# Patient Record
Sex: Female | Born: 1945 | Race: White | Hispanic: No | Smoking: Never smoker
Health system: Southern US, Community
[De-identification: ages and names within clinical notes are randomized; demographics above are authoritative.]

## PROBLEM LIST (undated history)

## (undated) DIAGNOSIS — N183 Chronic kidney disease, stage 3 unspecified: Secondary | ICD-10-CM

## (undated) DIAGNOSIS — K5792 Diverticulitis of intestine, part unspecified, without perforation or abscess without bleeding: Secondary | ICD-10-CM

## (undated) DIAGNOSIS — L989 Disorder of the skin and subcutaneous tissue, unspecified: Secondary | ICD-10-CM

## (undated) DIAGNOSIS — R112 Nausea with vomiting, unspecified: Secondary | ICD-10-CM

## (undated) DIAGNOSIS — Z9049 Acquired absence of other specified parts of digestive tract: Secondary | ICD-10-CM

## (undated) DIAGNOSIS — N289 Disorder of kidney and ureter, unspecified: Secondary | ICD-10-CM

## (undated) DIAGNOSIS — G43909 Migraine, unspecified, not intractable, without status migrainosus: Secondary | ICD-10-CM

## (undated) DIAGNOSIS — K219 Gastro-esophageal reflux disease without esophagitis: Secondary | ICD-10-CM

## (undated) DIAGNOSIS — Z9889 Other specified postprocedural states: Secondary | ICD-10-CM

## (undated) DIAGNOSIS — M1990 Unspecified osteoarthritis, unspecified site: Secondary | ICD-10-CM

## (undated) DIAGNOSIS — I10 Essential (primary) hypertension: Secondary | ICD-10-CM

## (undated) DIAGNOSIS — Z8719 Personal history of other diseases of the digestive system: Secondary | ICD-10-CM

## (undated) HISTORY — DX: Disorder of the skin and subcutaneous tissue, unspecified: L98.9

## (undated) HISTORY — DX: Disorder of kidney and ureter, unspecified: N28.9

## (undated) HISTORY — DX: Essential (primary) hypertension: I10

## (undated) HISTORY — PX: UPPER GASTROINTESTINAL ENDOSCOPY: SHX188

## (undated) HISTORY — PX: HEMORROIDECTOMY: SUR656

## (undated) HISTORY — DX: Gastro-esophageal reflux disease without esophagitis: K21.9

## (undated) HISTORY — DX: Diverticulitis of intestine, part unspecified, without perforation or abscess without bleeding: K57.92

## (undated) HISTORY — PX: COLONOSCOPY: SHX174

---
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
## 1981-04-21 HISTORY — PX: CYSTECTOMY: SUR359

## 1982-04-21 HISTORY — PX: BUNIONECTOMY: SHX129

## 1990-04-21 HISTORY — PX: LAPAROSCOPIC CHOLECYSTECTOMY: SUR755

## 2000-03-19 ENCOUNTER — Other Ambulatory Visit: Admission: RE | Admit: 2000-03-19 | Discharge: 2000-03-19 | Payer: Self-pay | Admitting: *Deleted

## 2001-03-21 DIAGNOSIS — L989 Disorder of the skin and subcutaneous tissue, unspecified: Secondary | ICD-10-CM

## 2001-03-21 HISTORY — DX: Disorder of the skin and subcutaneous tissue, unspecified: L98.9

## 2001-03-22 ENCOUNTER — Other Ambulatory Visit: Admission: RE | Admit: 2001-03-22 | Discharge: 2001-03-22 | Payer: Self-pay | Admitting: *Deleted

## 2002-12-03 ENCOUNTER — Ambulatory Visit (HOSPITAL_COMMUNITY): Admission: RE | Admit: 2002-12-03 | Discharge: 2002-12-03 | Payer: Self-pay | Admitting: Emergency Medicine

## 2002-12-03 ENCOUNTER — Encounter: Payer: Self-pay | Admitting: Emergency Medicine

## 2005-10-08 ENCOUNTER — Other Ambulatory Visit: Admission: RE | Admit: 2005-10-08 | Discharge: 2005-10-08 | Payer: Self-pay | Admitting: Gynecology

## 2006-12-18 ENCOUNTER — Other Ambulatory Visit: Admission: RE | Admit: 2006-12-18 | Discharge: 2006-12-18 | Payer: Self-pay | Admitting: Gynecology

## 2008-01-13 ENCOUNTER — Encounter: Payer: Self-pay | Admitting: Women's Health

## 2008-01-13 ENCOUNTER — Other Ambulatory Visit: Admission: RE | Admit: 2008-01-13 | Discharge: 2008-01-13 | Payer: Self-pay | Admitting: Gynecology

## 2008-01-13 ENCOUNTER — Ambulatory Visit: Payer: Self-pay | Admitting: Women's Health

## 2008-06-24 ENCOUNTER — Encounter (INDEPENDENT_AMBULATORY_CARE_PROVIDER_SITE_OTHER): Payer: Self-pay | Admitting: *Deleted

## 2008-06-24 ENCOUNTER — Inpatient Hospital Stay (HOSPITAL_COMMUNITY): Admission: EM | Admit: 2008-06-24 | Discharge: 2008-06-27 | Payer: Self-pay | Admitting: Emergency Medicine

## 2008-06-27 ENCOUNTER — Encounter (INDEPENDENT_AMBULATORY_CARE_PROVIDER_SITE_OTHER): Payer: Self-pay | Admitting: *Deleted

## 2008-08-18 DIAGNOSIS — I10 Essential (primary) hypertension: Secondary | ICD-10-CM | POA: Insufficient documentation

## 2008-08-18 DIAGNOSIS — K5732 Diverticulitis of large intestine without perforation or abscess without bleeding: Secondary | ICD-10-CM | POA: Insufficient documentation

## 2008-08-18 DIAGNOSIS — K219 Gastro-esophageal reflux disease without esophagitis: Secondary | ICD-10-CM | POA: Insufficient documentation

## 2008-08-18 DIAGNOSIS — K5730 Diverticulosis of large intestine without perforation or abscess without bleeding: Secondary | ICD-10-CM | POA: Insufficient documentation

## 2008-08-18 DIAGNOSIS — D649 Anemia, unspecified: Secondary | ICD-10-CM | POA: Insufficient documentation

## 2008-08-18 DIAGNOSIS — N281 Cyst of kidney, acquired: Secondary | ICD-10-CM | POA: Insufficient documentation

## 2008-08-23 ENCOUNTER — Ambulatory Visit: Payer: Self-pay | Admitting: Internal Medicine

## 2008-08-23 DIAGNOSIS — E669 Obesity, unspecified: Secondary | ICD-10-CM | POA: Insufficient documentation

## 2008-08-23 DIAGNOSIS — M129 Arthropathy, unspecified: Secondary | ICD-10-CM | POA: Insufficient documentation

## 2008-09-03 ENCOUNTER — Emergency Department (HOSPITAL_COMMUNITY): Admission: EM | Admit: 2008-09-03 | Discharge: 2008-09-04 | Payer: Self-pay | Admitting: Emergency Medicine

## 2008-09-04 ENCOUNTER — Telehealth: Payer: Self-pay | Admitting: Internal Medicine

## 2008-09-07 ENCOUNTER — Telehealth: Payer: Self-pay | Admitting: Internal Medicine

## 2008-10-13 ENCOUNTER — Ambulatory Visit: Payer: Self-pay | Admitting: Internal Medicine

## 2009-03-18 ENCOUNTER — Emergency Department (HOSPITAL_COMMUNITY): Admission: EM | Admit: 2009-03-18 | Discharge: 2009-03-18 | Payer: Self-pay | Admitting: Emergency Medicine

## 2009-05-31 ENCOUNTER — Encounter: Admission: RE | Admit: 2009-05-31 | Discharge: 2009-05-31 | Payer: Self-pay | Admitting: Otolaryngology

## 2009-10-03 ENCOUNTER — Ambulatory Visit: Payer: Self-pay | Admitting: Internal Medicine

## 2009-10-03 ENCOUNTER — Telehealth: Payer: Self-pay | Admitting: Internal Medicine

## 2009-10-04 LAB — CONVERTED CEMR LAB
ALT: 13 units/L (ref 0–35)
Albumin: 3.8 g/dL (ref 3.5–5.2)
Alkaline Phosphatase: 44 units/L (ref 39–117)
BUN: 16 mg/dL (ref 6–23)
Basophils Relative: 0.7 % (ref 0.0–3.0)
Chloride: 105 meq/L (ref 96–112)
Creatinine, Ser: 0.5 mg/dL (ref 0.4–1.2)
Hemoglobin: 13.3 g/dL (ref 12.0–15.0)
MCHC: 35.4 g/dL (ref 30.0–36.0)
Monocytes Relative: 7 % (ref 3.0–12.0)
Neutro Abs: 2.2 10*3/uL (ref 1.4–7.7)
Platelets: 165 10*3/uL (ref 150.0–400.0)
Potassium: 3.7 meq/L (ref 3.5–5.1)
RDW: 13.2 % (ref 11.5–14.6)
Sodium: 145 meq/L (ref 135–145)
Total Bilirubin: 0.7 mg/dL (ref 0.3–1.2)
Total Protein: 6.2 g/dL (ref 6.0–8.3)

## 2010-04-21 HISTORY — PX: SHOULDER ARTHROSCOPY W/ ROTATOR CUFF REPAIR: SHX2400

## 2010-05-21 NOTE — Progress Notes (Signed)
Summary: DIVERTICULITIS  Phone Note Call from Patient Call back at Work Phone [phone]   Caller: Patient Call For: Juanda Chance Reason for Call: Talk to Nurse Summary of Call: Paitent states that she has a flrare-up of her diverticulitis and was told to call our office to get an rx. Initial call taken by: Tawni Levy,  October 03, 2009 8:31 AM  Follow-up for Phone Call        Last OV 08-23-08, last Colon 10-13-08.   Pt. calling this morning with c/o LLQ abd.pain for 2 days, some increase bloating and more frequent stools.  Denies fever,n/v,constipation,diarrhea,blood. Would like antibiotics called in.  DR.Quintana Canelo PLEASE ADVISE  Follow-up by: Laureen Ochs LPN,  October 03, 2009 8:43 AM  Additional Follow-up for Phone Call Additional follow up Details #1::        please start Cipro 500 mg by mouth two times a day x 10days, #20, and Flagyl 250 mg by mouth three times a day, # 30, x 10 day. Full liquid diet, ? Bentyl 10 mg by mouth three times a day , #30, CBC,C-met. Additional Follow-up by: Hart Carwin MD,  October 03, 2009 9:36 AM    Additional Follow-up for Phone Call Additional follow up Details #2::    I have left a message for the patient to call back. Dottie Nelson-Smith CMA Duncan Dull)  October 03, 2009 9:55 AM   I have spoken to patient and advised her that she is to be on a full liquid diet for at least 24 hours and she can progress as tolerated to bland foods. Patient will have her labwork drawn today. She will also pick up her cipro, flagyl and bentyl at the pharmacy. I have confirmed that she has no allergies to antibiotics. I have also advised patient that should she begin having more symptoms or not start feeling better, she is to call our office. She verbalizes understanding. Dottie Nelson-Smith CMA Duncan Dull)  October 03, 2009 1:57 PM   New/Updated Medications: CIPROFLOXACIN HCL 500 MG TABS (CIPROFLOXACIN HCL) Take 1 tablet by mouth two times a day x 10 days FLAGYL 250 MG TABS  (METRONIDAZOLE) Take 1 tablet by mouth three times a day x 10 days BENTYL 10 MG CAPS (DICYCLOMINE HCL) Take 1 capsule by mouth three times a day as needed for abdominal cramping Prescriptions: BENTYL 10 MG CAPS (DICYCLOMINE HCL) Take 1 capsule by mouth three times a day as needed for abdominal cramping  #30 x 0   Entered by:   Lamona Curl CMA (AAMA)   Authorized by:   Hart Carwin MD   Signed by:   Lamona Curl CMA (AAMA) on 10/03/2009   Method used:   [facility] to        [facility]  [place] [address] (retail)       [address]       [place], [place]  [address]       Ph: [phone] or [phone]       Fax: [phone]   RxID:   [id number] FLAGYL 250 MG TABS (METRONIDAZOLE) Take 1 tablet by mouth three times a day x 10 days  #30 x 0   Entered by:   Lamona Curl CMA (AAMA)   Authorized by:   Hart Carwin MD   Signed by:   Lamona Curl CMA (AAMA) on 10/03/2009   Method used:   [facility] to        [facility]  [place] [address] [phone][facility] (retail)  [address]       [place], [place]  [address]       Ph: [phone] or [phone]       Fax: [phone]   RxID:   [id number] CIPROFLOXACIN HCL 500 MG TABS (CIPROFLOXACIN HCL) Take 1 tablet by mouth two times a day x 10 days  #20 x 0   Entered by:   Lamona Curl CMA (AAMA)   Authorized by:   Hart Carwin MD   Signed by:   Lamona Curl CMA (AAMA) on 10/03/2009   Method used:   [facility]        [facility]  [place] [address] (retail[facility]       [address]       [place], [place]  [address]       Ph: [phone] or [phone]       Fax: [phone]   RxID:   [id number]

## 2010-07-18 ENCOUNTER — Telehealth: Payer: Self-pay | Admitting: Internal Medicine

## 2010-07-18 MED ORDER — METRONIDAZOLE 250 MG PO TABS: 250.0000 mg | ORAL_TABLET | Freq: Three times a day (TID) | ORAL | Status: DC

## 2010-07-18 MED ORDER — CIPROFLOXACIN HCL 500 MG PO TABS: 500.0000 mg | ORAL_TABLET | Freq: Two times a day (BID) | ORAL | Status: AC

## 2010-07-18 NOTE — Telephone Encounter (Signed)
Chart reviewed, s/p hospit for diverticulitis in 06/2008, colon confirmed, Please start bowl rest-full liquids x 48hrs, Cipro 500 mg po bid, #20, 1 refill and Flagyl 250 mg, #30  1 po tid, no refill,call us back with status update  On Monday  July 21, 2008

## 2010-07-18 NOTE — Telephone Encounter (Signed)
Spoke with patient and gave her Dr. Regino Schultze recommendations. Rx's sent to patient's pharmacy. Patient will call Monday to give an update.

## 2010-07-18 NOTE — Telephone Encounter (Signed)
Patient calling to report LLQ abdominal pain that started last night. A lot of gas, bloating and frequent stools of small amount. Denies nausea, vomiting, diarrhea, fever or bleeding. Last colon- 10/13/08- mod. Diverticulosis. Last ov- 09/07/08.

## 2010-07-22 ENCOUNTER — Ambulatory Visit (INDEPENDENT_AMBULATORY_CARE_PROVIDER_SITE_OTHER): Payer: BC Managed Care – PPO | Admitting: Internal Medicine

## 2010-07-22 ENCOUNTER — Other Ambulatory Visit (INDEPENDENT_AMBULATORY_CARE_PROVIDER_SITE_OTHER): Payer: BC Managed Care – PPO

## 2010-07-22 ENCOUNTER — Telehealth: Payer: Self-pay | Admitting: Internal Medicine

## 2010-07-22 ENCOUNTER — Encounter: Payer: Self-pay | Admitting: Internal Medicine

## 2010-07-22 DIAGNOSIS — K5732 Diverticulitis of large intestine without perforation or abscess without bleeding: Secondary | ICD-10-CM

## 2010-07-22 DIAGNOSIS — R1032 Left lower quadrant pain: Secondary | ICD-10-CM

## 2010-07-22 LAB — COMPREHENSIVE METABOLIC PANEL
ALT: 20 U/L (ref 0–35)
AST: 25 U/L (ref 0–37)
BUN: 13 mg/dL (ref 6–23)
CO2: 29 mEq/L (ref 19–32)
Calcium: 9 mg/dL (ref 8.4–10.5)
Chloride: 98 mEq/L (ref 96–112)
Creatinine, Ser: 0.5 mg/dL (ref 0.4–1.2)
Sodium: 139 mEq/L (ref 135–145)

## 2010-07-22 LAB — CBC WITH DIFFERENTIAL/PLATELET
Basophils Relative: 0.8 % (ref 0.0–3.0)
Eosinophils Relative: 1.3 % (ref 0.0–5.0)
HCT: 42.5 % (ref 36.0–46.0)
Hemoglobin: 14.5 g/dL (ref 12.0–15.0)
Lymphocytes Relative: 32.3 % (ref 12.0–46.0)
MCHC: 34.2 g/dL (ref 30.0–36.0)
Monocytes Relative: 7.2 % (ref 3.0–12.0)
Neutrophils Relative %: 58.4 % (ref 43.0–77.0)
Platelets: 199 10*3/uL (ref 150.0–400.0)

## 2010-07-22 LAB — SEDIMENTATION RATE: Sed Rate: 26 mm/hr — ABNORMAL HIGH (ref 0–22)

## 2010-07-22 MED ORDER — DICYCLOMINE HCL 10 MG PO CAPS: ORAL_CAPSULE | ORAL | Status: DC

## 2010-07-22 NOTE — Telephone Encounter (Signed)
I have spoken to the pt, and ask her to come to see me this afternoon. DB

## 2010-07-22 NOTE — Progress Notes (Signed)
Michelle Coleman October 07, 1945 MRN [id number]    History of Present Illness:  This is a 65 year old white female with a five-day history of left lower quadrant abdominal pain and with a personal history of diverticulitis in March 2010 requiring hospitalization. A CT scan at that time showed phlegmon along the left colon. She responded to intravenous antibiotics. She subsequently had a colonoscopy in June 2010 which confirmed moderately severe diverticulosis of the left colon  This time, the symptoms are similar. She denies having any fever. We started her on Cipro and Flagyl 250 mg. Her prior colonoscopy was in 2003. There is no family history of colon cancer. Other medical problems include arthritis and hypertension.  She was taking metronidazole tid 3 days ago but she is not feeling much better, specifically she has a lack of energy and had to come back from work this morning because of diarrhea. She denies rectal bleeding. Her abdomen feels full and bloated. She has a prior history of a cholecystectomy. There is no family history of colon cancer. Prior colonoscopy was in 2003   Past Medical History  Diagnosis Date  . Hypertension   . GERD (gastroesophageal reflux disease)   . Diverticulosis   . Renal cyst     left  . Anemia    Past Surgical History  Procedure Date  . Cholecystectomy   . Rotator cuff repair Jan 2012    reports that she has never smoked. She has never used smokeless tobacco. She reports that she does not drink alcohol or use illicit drugs. family history includes Hypertension in her mother; Stomach cancer in her maternal grandfather; and Uterine cancer in her maternal grandmother.  There is no history of Colon cancer. Allergies  Allergen Reactions  . Codeine   . Morphine         Review of Systems: Denies chest pain shortness of breath or coughing. Admits to abdominal bloating and fullness. No urinary symptoms. No pedal edema no back problems  The remainder of the 10   point ROS is negative except as outlined in H&P   Physical Exam: General appearance  Well developed, in no distress. Eyes- non icteric. HEENT nontraumatic, normocephalic. Mouth no lesions, tongue papillated, no cheilosis. Neck supple without adenopathy, thyroid not enlarged, no carotid bruits, no JVD. Lungs Clear to auscultation bilaterally. Cor normal S1 normal S2, regular rhythm , no murmur,  quiet precordium. Abdomen soft not distended. Soft bowel sounds. Mild tenderness on deep pressure in left lower quadrant without rebound or fullness. Right lower quadrant is unremarkable. Liver edge is at the costal margin . Rectal: Soft Hemoccult negative stool, no pain with digital exam. Extremities no pedal edema. Skin no lesions. Neurological alert and oriented x 3. Psychological normal mood and affect.  Assessment and Plan:  Problem #1 lower abdominal pain localized to the left lower quadrant resembling her prior diverticulitis 2 years ago. So far, she has not responded to oral Cipro and Flagyl. She has been on bowel rest for the past 48 hours. We will proceed with a CT scan of the abdomen and pelvis and obtain labs which include CBC, metabolic panel and sedimentation rate. I will also start her on Bentyl 10 mg twice a day.  Problem #2 gastroesophageal reflux. This is controlled on omeprazole 20 mg daily.   07/22/2010 Lina Sar

## 2010-07-22 NOTE — Patient Instructions (Addendum)
You have been scheduled for a CT scan of the abdomen and pelvis. Please see the written instructions given to you at your last visit. Your physician has requested that you go to the basement for the following lab work before leaving today: CMET, CBC, Sedimentation rate Continue Flagyl and Cipro We have sent a prescription for Bentyl 10 mg. You should take 1 tablet by mouth twice daily. We will send #30 with 1 refill  ccDr M.Kalish

## 2010-07-22 NOTE — Telephone Encounter (Signed)
Patient calling to report she has no energy and still feels bad. States she started having diarrhea on Saturday AM and is still having frequent small amounts of diarrhea. She is not taking anything for the diarrhea. She is taking her Cipro and Flagyl (since 3/29) which she states usually cause her to have diarrhea. States she also has, upper abdomen pain right under her bra area. Describes this as a sore, achy pain not burning. Denies bleeding, fever, nausea or vomiting. Please, advise.

## 2010-07-23 ENCOUNTER — Ambulatory Visit (INDEPENDENT_AMBULATORY_CARE_PROVIDER_SITE_OTHER)
Admission: RE | Admit: 2010-07-23 | Discharge: 2010-07-23 | Disposition: A | Discharge status: Home / Self Care | Payer: BC Managed Care – PPO | Source: Ambulatory Visit | Attending: Internal Medicine | Admitting: Internal Medicine

## 2010-07-23 ENCOUNTER — Telehealth: Payer: Self-pay | Admitting: *Deleted

## 2010-07-23 DIAGNOSIS — K5732 Diverticulitis of large intestine without perforation or abscess without bleeding: Secondary | ICD-10-CM

## 2010-07-23 DIAGNOSIS — R1032 Left lower quadrant pain: Secondary | ICD-10-CM

## 2010-07-23 HISTORY — DX: Acquired absence of other specified parts of digestive tract: Z90.49

## 2010-07-23 MED ORDER — IOHEXOL 300 MG/ML  SOLN
100.0000 mL | Freq: Once | INTRAMUSCULAR | Status: AC | PRN
  Administered 2010-07-23: 100 mL via INTRAVENOUS

## 2010-07-23 NOTE — Telephone Encounter (Signed)
Patient given CT results and Dr. Regino Schultze recommendations.

## 2010-07-23 NOTE — Telephone Encounter (Signed)
Message copied by Jesse Fall on Tue Jul 23, 2010  2:06 PM ------      Message from: [place], [place]      Created: Tue Jul 23, 2010  1:09 PM       Please call pt with  negative CT scan for diverticulitis,, no abscess. Continue antibiotics  flr total of 1 week then stop. . Stay on soft diet. Pain likely due to diverticulosis ( not -itis)

## 2010-07-30 LAB — BASIC METABOLIC PANEL
CO2: 32 mEq/L (ref 19–32)
Chloride: 105 mEq/L (ref 96–112)
GFR calc Af Amer: >60 mL/min (ref >60)
GFR calc non Af Amer: >60 mL/min (ref >60)
Glucose, Bld: 112 mg/dL — ABNORMAL HIGH (ref 70–99)
Sodium: 142 mEq/L (ref 135–145)

## 2010-07-30 LAB — CBC
MCHC: 34.2 g/dL (ref 30.0–36.0)
Platelets: 190 10*3/uL (ref 150–400)
RDW: 13.9 % (ref 11.5–15.5)

## 2010-07-30 LAB — POCT CARDIAC MARKERS
CKMB, poc: <1 ng/mL — ABNORMAL LOW (ref 1.0–8.0)
Myoglobin, poc: 36.9 ng/mL (ref 12–200)
Troponin i, poc: <0.05 ng/mL (ref 0.00–0.09)

## 2010-07-30 LAB — DIFFERENTIAL
Basophils Relative: 1 % (ref 0–1)
Neutro Abs: 4.3 10*3/uL (ref 1.7–7.7)
Neutrophils Relative %: 62 % (ref 43–77)

## 2010-08-01 LAB — COMPREHENSIVE METABOLIC PANEL
AST: 23 U/L (ref 0–37)
Albumin: 2.7 g/dL — ABNORMAL LOW (ref 3.5–5.2)
Alkaline Phosphatase: 51 U/L (ref 39–117)
BUN: 5 mg/dL — ABNORMAL LOW (ref 6–23)
Chloride: 112 mEq/L (ref 96–112)
Creatinine, Ser: 0.57 mg/dL (ref 0.4–1.2)
GFR calc Af Amer: >60 mL/min (ref >60)
Potassium: 3 mEq/L — ABNORMAL LOW (ref 3.5–5.1)
Total Bilirubin: 0.6 mg/dL (ref 0.3–1.2)
Total Protein: 5.2 g/dL — ABNORMAL LOW (ref 6.0–8.3)

## 2010-08-01 LAB — MAGNESIUM: Magnesium: 2 mg/dL (ref 1.5–2.5)

## 2010-08-01 LAB — CLOSTRIDIUM DIFFICILE EIA: C difficile Toxins A+B, EIA: NEGATIVE

## 2010-08-01 LAB — CARDIAC PANEL(CRET KIN+CKTOT+MB+TROPI)
CK, MB: 0.8 ng/mL (ref 0.3–4.0)
CK, MB: 1.2 ng/mL (ref 0.3–4.0)
Relative Index: INVALID (ref 0.0–2.5)
Troponin I: <0.01 ng/mL (ref 0.00–0.06)

## 2010-08-01 LAB — CBC
HCT: 32.6 % — ABNORMAL LOW (ref 36.0–46.0)
Hemoglobin: 13.7 g/dL (ref 12.0–15.0)
Platelets: 128 10*3/uL — ABNORMAL LOW (ref 150–400)
RBC: 4.55 MIL/uL (ref 3.87–5.11)
RDW: 13.9 % (ref 11.5–15.5)
RDW: 14 % (ref 11.5–15.5)
WBC: 10.9 10*3/uL — ABNORMAL HIGH (ref 4.0–10.5)
WBC: 4.4 10*3/uL (ref 4.0–10.5)

## 2010-08-01 LAB — URINALYSIS, ROUTINE W REFLEX MICROSCOPIC
Ketones, ur: 15 mg/dL — AB
Specific Gravity, Urine: 1.022 (ref 1.005–1.030)
Urobilinogen, UA: 1 mg/dL (ref 0.0–1.0)

## 2010-08-01 LAB — POTASSIUM: Potassium: 3.6 mEq/L (ref 3.5–5.1)

## 2010-08-01 LAB — DIFFERENTIAL
Eosinophils Absolute: 0 10*3/uL (ref 0.0–0.7)
Lymphocytes Relative: 18 % (ref 12–46)
Monocytes Absolute: 0.7 10*3/uL (ref 0.1–1.0)
Monocytes Relative: 6 % (ref 3–12)
Neutro Abs: 8.2 10*3/uL — ABNORMAL HIGH (ref 1.7–7.7)

## 2010-08-01 LAB — GIARDIA/CRYPTOSPORIDIUM SCREEN(EIA)

## 2010-08-01 LAB — CULTURE, BLOOD (ROUTINE X 2): Culture: NO GROWTH

## 2010-08-01 LAB — POCT I-STAT, CHEM 8
Calcium, Ion: 1.11 mmol/L — ABNORMAL LOW (ref 1.12–1.32)
Chloride: 103 mEq/L (ref 96–112)
Glucose, Bld: 113 mg/dL — ABNORMAL HIGH (ref 70–99)
Potassium: 3.3 mEq/L — ABNORMAL LOW (ref 3.5–5.1)

## 2010-08-01 LAB — CK TOTAL AND CKMB (NOT AT ARMC)
Relative Index: INVALID (ref 0.0–2.5)
Total CK: 41 U/L (ref 7–177)

## 2010-08-01 LAB — CALCIUM: Calcium: 7.9 mg/dL — ABNORMAL LOW (ref 8.4–10.5)

## 2010-08-01 LAB — TROPONIN I: Troponin I: <0.01 ng/mL (ref 0.00–0.06)

## 2010-08-01 LAB — STOOL CULTURE

## 2010-08-01 LAB — LIPID PANEL
Cholesterol: 136 mg/dL (ref 0–200)
HDL: 39 mg/dL — ABNORMAL LOW (ref >39)

## 2010-08-01 LAB — PHOSPHORUS: Phosphorus: 3 mg/dL (ref 2.3–4.6)

## 2010-08-01 LAB — TSH: TSH: 1.813 u[IU]/mL (ref 0.350–4.500)

## 2010-09-03 NOTE — Discharge Summary (Signed)
Michelle Coleman, Michelle Coleman NO.:  [id number]   MEDICAL RECORD NO.:  [id number]          PATIENT TYPE:  INP   LOCATION:  6729                         FACILITY:  [facility]   PHYSICIAN:  Michelene Gardener, MD    DATE OF BIRTH:  1945/10/23   DATE OF ADMISSION:  06/24/2008  DATE OF DISCHARGE:  06/27/2008                               DISCHARGE SUMMARY   DISCHARGE DIAGNOSES:  1. Acute sigmoid diverticulitis.  2. Gastroesophageal reflux disease.  3. Hypokalemia.  4. Hypertension.   DISCHARGE MEDICATIONS:  1. Lisinopril/hydrochlorothiazide 20/25 mg p.o. once daily.  2. Prilosec 20 mg p.o. once daily.  3. Ciprofloxacin 500 mg p.o. twice daily for 1 week.  4. Flagyl 500 mg p.o. 3 times daily for 1 week.   CONSULTATIONS:  None.   PROCEDURES:  None.   RADIOLOGY STUDIES:  1. 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  2. CT scan of the pelvis showed sigmoid diverticulitis with tiny      contained perforation.   HOSPITAL COURSE:  1. Acute diverticulitis.  This patient presented on June 24, 2008,      with complaint of abdominal pain.  CT scan of the abdomen and      pelvis was done and it showed evidence of diverticulitis with no      abscess and no free air and it showed tiny locules of gas which is      possibly representing contained perforation.  Surgery was contacted      and CT scan of the pelvis was reviewed and they do not think there      is any perforation and there is no need for surgical intervention      at this time and only medical treatment was recommended.  The      patient was admitted to the hospital.  She was kept initially      n.p.o. and then her diet was increased to clear liquid diet and      then to soft diet.  She was kept on IV fluids in addition to      ciprofloxacin and Flagyl.  The patient improved very quick during      the  hospital.  Currently, at the time of discharge, the patient had      minimal abdominal pain.  Her diarrhea almost resolved and last      bowel movement was yesterday.  The patient will be discharged home      on ciprofloxacin and Flagyl to be taken for 1 week.  I recommended      her to follow with her primary doctor with consideration of      repeating her CAT scan if her symptoms persist or worsen.  2. Diarrhea that is most likely related to underlying diverticulitis.      Stool studies were sent and came negative for C. diff.  Cultures  were negative as well.  The patient was recommended to follow with      her primary doctor for evaluation of possible colonoscopy which is      normally recommended after 3-4 weeks after her acute onset of      diverticulitis that was discussed with the patient who voiced      understanding.  3. Hypokalemia that was secondary to her diarrhea and that was      corrected with oral potassium.  Magnesium level was checked and it      was normal.  4. GERD.  The patient was continued on Prilosec.  5. Hypertension.  The patient was continued on her outpatient      medication that include hydrochlorothiazide and lisinopril.   Otherwise, other medical conditions remained stable during the hospital.  At the time of discharge, the patient is in stable condition.  She had  mild abdominal pain and diarrhea resolved and she is tolerating a soft  diet very well.  She is instructed to take soft diet for 2-3 days and  then to increase it to regular diet.  I also instructed her to follow up  with her primary doctor for possible repeat CAT scan if her symptoms  persist.  I also discussed the findings of her CAT scan that showed  partially visualized medial right lower lobe low-density lesion that  might represent a small cyst.  That might need CT followup in around 6  months.  Otherwise, other medical conditions remained stable.   TOTAL ASSESSMENT TIME:  40  minutes.      Michelene Gardener, MD  Electronically Signed     NAE/MEDQ  D:  06/27/2008  T:  06/27/2008  Job:  [id number]

## 2010-09-03 NOTE — H&P (Signed)
NAME:  Michelle Coleman, Michelle Coleman NO.:  [id number]   MEDICAL RECORD NO.:  [id number]          PATIENT TYPE:  EMS   LOCATION:  MAJO                         FACILITY:  [facility]   PHYSICIAN:  Lonia Blood, M.D.      DATE OF BIRTH:  10-17-1945   DATE OF ADMISSION:  06/23/2008  DATE OF DISCHARGE:                              HISTORY & PHYSICAL   PRIMARY CARE PHYSICIAN:  The patient is unassigned.   PRESENTING COMPLAINT:  Abdominal pain.   HISTORY OF PRESENT ILLNESS:  The patient is a 65 year old female with  history of hypertension and GERD who lives in [place].  Her physician  is in [facility].  She came in secondary to 1-day history of abdominal  pain that started around 6:00 a.m. this morning.  She also has had  suprapubic pressure.  She had nausea but no vomiting.  She has had 3  bowel movements so far today.  The patient ate a lot of corn last night.  Otherwise, she denied any sick contacts.  Her pain is persistent and  rated as up to 6/10.  There is no associated diarrhea, no fever, no  dysuria.   PAST MEDICAL HISTORY:  Mainly hypertension and GERD.   ALLERGIES:  CODEINE and some other medication called SODIUM PENTOTHAL.   MEDICATIONS:  1. Lisinopril/hydrochlorothiazide 20/25 one tablet daily.  2. Over-the-counter Prilosec.   SOCIAL HISTORY:  The patient is married, lives here in [place] with  her husband.  Denied any tobacco, alcohol or IV drug use.   FAMILY HISTORY:  Mainly hypertension.   REVIEW OF SYSTEMS:  A 12-point review of systems is negative except as  per HPI.   PHYSICAL EXAMINATION:  VITAL SIGNS:  Temperature is 98.5, blood pressure  152/74, pulse 78, respiratory rate 18, saturations 97% on room air.  GENERALLY:  She is awake, alert, oriented, in no acute distress.  HEENT:  PERRL.  EOMI.  NECK:  Supple.  No JVD, no lymphadenopathy.  RESPIRATORY:  She has good air entry bilaterally.  No wheezes, no rales.  CARDIOVASCULAR SYSTEM:  She has S1, S2, no  murmur.  ABDOMEN:  Soft with left lower quadrant tenderness all the way to  suprapubic area.  EXTREMITIES:  Show no edema, cyanosis or clubbing.   LABORATORIES/IMAGING STUDIES:  A white count is 10.9 with left shift,  ANC of 8.2.  Hemoglobin is 13.7, platelet count 188.  Urinalysis is  negative.  Sodium 141, potassium 3.3, chloride 103, BUN 12, creatinine  0.6, glucose 113, ionized calcium 1.11.  CT abdomen and pelvis is  consistent with sigmoid diverticulitis.   ASSESSMENT:  Therefore, this is a 65 year old female presenting with  what appears to be sigmoid diverticulitis.  The patient is currently  stable with no nausea, vomiting, or diarrhea.   PLAN:  1. Acute sigmoid diverticulitis:  We will admit the patient.  We will      start her on IV Flagyl and Cipro.  Bowel rest, hydration.  We  will      put her on clear liquids until she is able to  eat a regular diet.      Once she is able to do that, would start her on a full heart-      healthy diet.  2.  Hypertension:  I will hold the patient's      lisinopril/hydrochlorothiazide at the moment.  I will use p.r.n. IV      medication as necessary.  Once she is able to eat adequately, we      will resume her home therapy.  2. Gastroesophageal reflux disease:  I will continue with PPI here in      the hospital.  3. Hypokalemia:  We will replete the patient's potassium promptly.  4. Further treatment will depend on how the patient does in the      hospital.      Lonia Blood, M.D.  Electronically Signed     LG/MEDQ  D:  06/24/2008  T:  06/24/2008  Job:  [id number]

## 2011-02-13 ENCOUNTER — Other Ambulatory Visit (HOSPITAL_COMMUNITY)
Admission: RE | Admit: 2011-02-13 | Discharge: 2011-02-13 | Disposition: A | Discharge status: Home / Self Care | Payer: BC Managed Care – PPO | Source: Ambulatory Visit | Attending: Gynecology | Admitting: Gynecology

## 2011-02-13 ENCOUNTER — Ambulatory Visit (INDEPENDENT_AMBULATORY_CARE_PROVIDER_SITE_OTHER): Payer: BC Managed Care – PPO | Admitting: Women's Health

## 2011-02-13 ENCOUNTER — Encounter: Payer: Self-pay | Admitting: Women's Health

## 2011-02-13 VITALS — BP 130/70 | Ht 65.25 in | Wt 206.0 lb

## 2011-02-13 DIAGNOSIS — Z23 Encounter for immunization: Secondary | ICD-10-CM

## 2011-02-13 DIAGNOSIS — L989 Disorder of the skin and subcutaneous tissue, unspecified: Secondary | ICD-10-CM | POA: Insufficient documentation

## 2011-02-13 DIAGNOSIS — L940 Localized scleroderma [morphea]: Secondary | ICD-10-CM

## 2011-02-13 DIAGNOSIS — Z01419 Encounter for gynecological examination (general) (routine) without abnormal findings: Secondary | ICD-10-CM | POA: Insufficient documentation

## 2011-02-13 DIAGNOSIS — L900 Lichen sclerosus et atrophicus: Secondary | ICD-10-CM

## 2011-02-13 MED ORDER — CLOBETASOL PROPIONATE 0.05 % EX OINT: TOPICAL_OINTMENT | CUTANEOUS | Status: DC | PRN

## 2011-02-13 NOTE — Patient Instructions (Signed)
zostovac pnuemovac SWIM!!!!

## 2011-02-13 NOTE — Progress Notes (Signed)
Addended by: [place], Inara Dike E on: 02/13/2011 10:35 AM   Modules accepted: Orders

## 2011-02-13 NOTE — Progress Notes (Signed)
Michelle Coleman 1945/07/24 [id number]    History:    The patient presents for annual exam.  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   Past medical history, past surgical history, family history and social history were all reviewed and documented in the EPIC chart.   ROS:  A  ROS was performed and pertinent positives and negatives are included in the history.  Exam:  Filed Vitals:   02/13/11 0937  BP: 130/70    General appearance:  Normal Head/Neck:  Normal, without cervical or supraclavicular adenopathy. Thyroid:  Symmetrical, normal in size, without palpable masses or nodularity. Respiratory  Effort:  Normal  Auscultation:  Clear without wheezing or rhonchi Cardiovascular  Auscultation:  Regular rate, without rubs, murmurs or gallops  Edema/varicosities:  Not grossly evident Abdominal  Soft,nontender, without masses, guarding or rebound.  Liver/spleen:  No organomegaly noted  Hernia:  None appreciated  Skin  Inspection:  Grossly normal  Palpation:  Grossly normal Neurologic/psychiatric  Orientation:  Normal with appropriate conversation.  Mood/affect:  Normal  Genitourinary    Breasts: Examined lying and sitting.     Right: Without masses, retractions, discharge or axillary adenopathy.     Left: Without masses, retractions, discharge or axillary adenopathy.   Inguinal/mons:  Normal without inguinal adenopathy  External genitalia:  Lichen sclerosus  BUS/Urethra/Skene's glands:  Normal  Bladder:  Normal  Vagina:  Normal  Cervix:  Normal  Uterus:   normal in size, shape and contour.  Midline and mobile  Adnexa/parametria:     Rt: Without masses or tenderness.   Lt: Without masses or tenderness.  Anus and perineum: Normal  Digital rectal exam: Normal sphincter tone without palpated masses or tenderness  Assessment/Plan:  65 y.o. MWF G2P2 for annual exam postmenopausal with no bleeding and no HRT. Primary care does her labs and medications for  hypertension. She had a negative colonoscopy with no polyps in 2011. History of diverticulitis. Lichen sclerosus stable with Temovate.  Postmenopausal/lichen sclerosus Hypertension/meds per primary care Diverticulitis/diverticulosis  Plan: Temovate twice a day when necessary. Did review to use small amounts. Encouraged to get back to her regular exercise, states had rotator cuff surgery last year and got out of routine of exercise. SBEs, annual mammogram which have been normal. DEXA will get scheduled at [facility] where she has had in the past. Encourage zostovac and Pneumovax at her annual office visit at primary care. Flu vaccine today. Home safety, fall prevention reviewed. Vitamin D 1000 daily encouraged. Pap only.  Harrington Challenger [facility], 10:21 AM 02/13/2011

## 2011-05-06 ENCOUNTER — Encounter (HOSPITAL_COMMUNITY): Payer: Self-pay | Admitting: *Deleted

## 2011-05-06 ENCOUNTER — Observation Stay (HOSPITAL_COMMUNITY)
Admission: EM | Admit: 2011-05-06 | Discharge: 2011-05-06 | Disposition: A | Discharge status: Home / Self Care | Payer: Medicare Other | Attending: Emergency Medicine | Admitting: Emergency Medicine

## 2011-05-06 DIAGNOSIS — R509 Fever, unspecified: Principal | ICD-10-CM | POA: Insufficient documentation

## 2011-05-06 DIAGNOSIS — R197 Diarrhea, unspecified: Secondary | ICD-10-CM | POA: Insufficient documentation

## 2011-05-06 DIAGNOSIS — R109 Unspecified abdominal pain: Secondary | ICD-10-CM | POA: Insufficient documentation

## 2011-05-06 DIAGNOSIS — E860 Dehydration: Secondary | ICD-10-CM

## 2011-05-06 DIAGNOSIS — I10 Essential (primary) hypertension: Secondary | ICD-10-CM | POA: Insufficient documentation

## 2011-05-06 DIAGNOSIS — K219 Gastro-esophageal reflux disease without esophagitis: Secondary | ICD-10-CM | POA: Insufficient documentation

## 2011-05-06 DIAGNOSIS — R112 Nausea with vomiting, unspecified: Secondary | ICD-10-CM

## 2011-05-06 LAB — POCT I-STAT, CHEM 8
Chloride: 106 mEq/L (ref 96–112)
HCT: 41 % (ref 36.0–46.0)
Potassium: 3.3 mEq/L — ABNORMAL LOW (ref 3.5–5.1)
Sodium: 143 mEq/L (ref 135–145)

## 2011-05-06 MED ORDER — MORPHINE SULFATE 2 MG/ML IJ SOLN: 4.0000 mg | INTRAMUSCULAR | Status: DC | PRN

## 2011-05-06 MED ORDER — SODIUM CHLORIDE 0.9 % IV BOLUS (SEPSIS)
1000.0000 mL | Freq: Once | INTRAVENOUS | Status: AC
  Administered 2011-05-06: 1000 mL via INTRAVENOUS

## 2011-05-06 MED ORDER — ONDANSETRON HCL 4 MG PO TABS: 4.0000 mg | ORAL_TABLET | Freq: Four times a day (QID) | ORAL | Status: AC

## 2011-05-06 MED ORDER — ONDANSETRON HCL 4 MG/2ML IJ SOLN: 4.0000 mg | Freq: Four times a day (QID) | INTRAMUSCULAR | Status: DC | PRN

## 2011-05-06 MED ORDER — SODIUM CHLORIDE 0.9 % IV SOLN
INTRAVENOUS | Status: DC
  Administered 2011-05-06: 19:00:00 via INTRAVENOUS

## 2011-05-06 MED ORDER — ONDANSETRON HCL 4 MG/2ML IJ SOLN
4.0000 mg | Freq: Once | INTRAMUSCULAR | Status: AC
  Administered 2011-05-06: 4 mg via INTRAVENOUS
  Filled 2011-05-06: qty 2

## 2011-05-06 NOTE — ED Provider Notes (Signed)
Patient placed in CDU under dehydration protocol.  After medications and IV fluids, patient feeling much improved.  Nausea and vomiting have subsided.  Patient is tolerating po challenge without difficulty.  Ambulatory to the bathroom without problems.  Patient states she feels like she would be able to return home.  Lungs CTA bilaterally.  S1/S2, RRR.  Abdomen soft, bowel sounds present.  Jimmye Norman, NP 05/06/11 2228

## 2011-05-06 NOTE — ED Notes (Signed)
Pt given ice chips

## 2011-05-06 NOTE — ED Notes (Signed)
Pt given ginger ale after tolerating ice chips. Provider made aware.

## 2011-05-06 NOTE — ED Notes (Signed)
Pt on dehydration protocol. Denies nausea presently after zofran admin. Pt currently receiving iv bolus.

## 2011-05-06 NOTE — ED Notes (Signed)
Pt given saltines to eat. Pt tolerated ginger ale w/o problem.

## 2011-05-06 NOTE — ED Notes (Signed)
Patient denies pain and is resting comfortably.  

## 2011-05-06 NOTE — ED Notes (Signed)
Pt woke up with fever, chills at 0400 and then started having vomiting and diarrhea since 0600 and not keeping anything down

## 2011-05-06 NOTE — ED Provider Notes (Signed)
History     CSN: [id number]  Arrival date & time 05/06/11  1359   First MD Initiated Contact with Patient 05/06/11 1740      Chief Complaint  Patient presents with  . Fever  . Emesis  . Diarrhea    Patient is a 66 y.o. female presenting with fever, vomiting, and diarrhea. The history is provided by the patient and a relative.  Fever Primary symptoms of the febrile illness include fever, fatigue, abdominal pain, nausea, vomiting and diarrhea. Primary symptoms do not include altered mental status. The current episode started today. This is a new problem. The problem has been gradually worsening.  Associated with: nothing.  Emesis  This is a new problem. The current episode started 12 to 24 hours ago. The problem occurs 5 to 10 times per day. The problem has been gradually worsening. The emesis has an appearance of stomach contents. Associated symptoms include abdominal pain, chills, diarrhea and a fever.  Diarrhea The primary symptoms include fever, fatigue, abdominal pain, nausea, vomiting and diarrhea. The illness began today. The onset was sudden.  The illness is also significant for chills.  her course is worsening Nothing improves symptoms Nothing worsens symptoms  Pt presents with nausea/vomiting/diarrhea - she reports multiple episodes of both nonbloody vomitus/stool Also reports abd cramping Recent abx for otitis media No foreign travel No hospitalizations She was well yesterday, this started this morning  Past Medical History  Diagnosis Date  . Hypertension   . GERD (gastroesophageal reflux disease)   . Diverticulosis   . Renal cyst     left  . Hx of cholecystectomy   . Skin abnormalities 03/2001    lichens sclerosis, kerotosis ( 11/2006)    Past Surgical History  Procedure Date  . Cholecystectomy 1992  . Rotator cuff repair Jan 2012  . Bunionectomy 1984  . Tonsillectomy and adenoidectomy 1950  . Facial cyst removal 1983    Family History  Problem  Relation Age of Onset  . Hypertension Mother   . Heart disease Mother   . Uterine cancer Maternal Grandmother   . Cancer Maternal Grandmother     uterine  . Stomach cancer Maternal Grandfather   . Colon cancer Neg Hx     History  Substance Use Topics  . Smoking status: Never Smoker   . Smokeless tobacco: Never Used  . Alcohol Use: No    OB History    Grav Para Term Preterm Abortions TAB SAB Ect Mult Living   2 2        2       Review of Systems  Constitutional: Positive for fever, chills and fatigue.  Gastrointestinal: Positive for nausea, vomiting, abdominal pain and diarrhea.  Psychiatric/Behavioral: Negative for altered mental status.  All other systems reviewed and are negative.    Allergies  Codeine and Morphine  Home Medications   Current Outpatient Rx  Name Route Sig Dispense Refill  . LISINOPRIL-HYDROCHLOROTHIAZIDE 20-25 MG PO TABS Oral Take 1 tablet by mouth daily.      Marland Kitchen OMEPRAZOLE MAGNESIUM 20 MG PO TBEC Oral Take 20 mg by mouth daily.      Marland Kitchen PROBIOTIC FORMULA PO Oral Take 1 capsule by mouth daily.       BP 163/82  Pulse 78  Temp(Src) 98.5 F (36.9 C) (Oral)  Resp 14  SpO2 96%  LMP 02/13/1996  Physical Exam CONSTITUTIONAL: Well developed/well nourished HEAD AND FACE: Normocephalic/atraumatic EYES: EOMI/PERRL, no scleral icterus ENMT: Mucous membranes dry NECK: supple no  meningeal signs SPINE:entire spine nontender CV: S1/S2 noted, no murmurs/rubs/gallops noted LUNGS: Lungs are clear to auscultation bilaterally, no apparent distress ABDOMEN: soft, nontender, no rebound or guarding, +BS GU:no cva tenderness NEURO: Pt is awake/alert, moves all extremitiesx4 EXTREMITIES: pulses normal, full ROM SKIN: warm, color normal PSYCH: no abnormalities of mood noted  ED Course  Procedures    Labs Reviewed  I-STAT, CHEM 8   6:11 PM Plan is to place on CDU dehydration protocol IV fluids, anti-nausea meds abd soft, doubt acute abd process at this  time   MDM  Nursing notes reviewed and considered in documentation All labs/vitals reviewed and considered         Joya Gaskins, MD 05/06/11 1929

## 2011-05-06 NOTE — ED Notes (Signed)
Pt denies pain but states sore from vomiting multiple times today

## 2011-05-06 NOTE — ED Notes (Signed)
After 500cc NS received, pt denies nausea presently and states feeling a lot better.

## 2011-05-07 NOTE — ED Provider Notes (Signed)
Medical screening examination/treatment/procedure(s) were conducted as a shared visit with non-physician practitioner(s) and myself.  I personally evaluated the patient during the encounter   Joya Gaskins, MD 05/07/11 1231

## 2012-03-04 ENCOUNTER — Ambulatory Visit (INDEPENDENT_AMBULATORY_CARE_PROVIDER_SITE_OTHER): Payer: Medicare Other | Admitting: Women's Health

## 2012-03-04 ENCOUNTER — Encounter: Payer: Self-pay | Admitting: Women's Health

## 2012-03-04 VITALS — BP 128/80 | Ht 65.0 in | Wt 215.0 lb

## 2012-03-04 DIAGNOSIS — Z23 Encounter for immunization: Secondary | ICD-10-CM

## 2012-03-04 DIAGNOSIS — Z01419 Encounter for gynecological examination (general) (routine) without abnormal findings: Secondary | ICD-10-CM

## 2012-03-04 DIAGNOSIS — N952 Postmenopausal atrophic vaginitis: Secondary | ICD-10-CM

## 2012-03-04 DIAGNOSIS — N760 Acute vaginitis: Secondary | ICD-10-CM

## 2012-03-04 MED ORDER — CLOBETASOL PROPIONATE 0.05 % EX CREA: TOPICAL_CREAM | CUTANEOUS | Status: DC

## 2012-03-04 NOTE — Progress Notes (Signed)
Michelle Coleman [birth year] [id number]    History:    The patient presents for annual exam.  Postmenopausal with no bleeding/no HRT since 2003. History of normal Paps. History of lichen sclerosus/biopsy 2006. Hypertension treated at primary care. Negative colonoscopy 2011. History of a right breast cyst 2002 with normal mammograms after. Has had Pneumovax and zostovac vaccine's.   Past medical history, past surgical history, family history and social history were all reviewed and documented in the EPIC chart. CPA, working part time. Husband diagnosed with bladder cancer this past year doing okay.   ROS:  A  ROS was performed and pertinent positives and negatives are included in the history.  Exam:  Filed Vitals:   03/04/12 0948  BP: 128/80    General appearance:  Normal Head/Neck:  Normal, without cervical or supraclavicular adenopathy. Thyroid:  Symmetrical, normal in size, without palpable masses or nodularity. Respiratory  Effort:  Normal  Auscultation:  Clear without wheezing or rhonchi Cardiovascular  Auscultation:  Regular rate, without rubs, murmurs or gallops  Edema/varicosities:  Not grossly evident Abdominal  Soft,nontender, without masses, guarding or rebound.  Liver/spleen:  No organomegaly noted  Hernia:  None appreciated  Skin  Inspection:  Grossly normal  Palpation:  Grossly normal Neurologic/psychiatric  Orientation:  Normal with appropriate conversation.  Mood/affect:  Normal  Genitourinary    Breasts: Examined lying and sitting.     Right: Without masses, retractions, discharge or axillary adenopathy.     Left: Without masses, retractions, discharge or axillary adenopathy.   Inguinal/mons:  Normal without inguinal adenopathy  External genitalia:  Lichen sclerosus near clitoral hood and left labia   BUS/Urethra/Skene's glands:  Normal  Bladder:  Normal  Vagina:  Normal  Cervix:  Normal  Uterus:   normal in size, shape and contour.  Midline and  mobile  Adnexa/parametria:     Rt: Without masses or tenderness.   Lt: Without masses or tenderness.  Anus and perineum: Normal  Digital rectal exam: Normal sphincter tone without palpated masses or tenderness  Assessment/Plan:  66 y.o. [name] G2 P2 for annual exam without complaint.  History of lichen sclerosus with good relief of symptoms with Temovate Hypertension-primary care labs and meds Normal colonoscopy 2011 History of benign right breast cyst 2002  Plan: Pap normal 2012, reviewed new screening guidelines. Continue labs at primary care. UA, home Hemoccult card given today. SBE's, continue annual mammogram, encouraged increased exercise, decrease calories for weight loss, vitamin D 2000 daily. Home safety and fall prevention discussed. Temovate prescription given to use as needed for symptoms of vaginal irritation. Aware to use sparingly. Instructed to call if symptoms persist.  Harrington Challenger [facility], 11:55 AM 03/04/2012

## 2012-03-04 NOTE — Patient Instructions (Addendum)

## 2012-03-12 ENCOUNTER — Other Ambulatory Visit: Payer: Self-pay | Admitting: Anesthesiology

## 2012-03-12 DIAGNOSIS — Z1211 Encounter for screening for malignant neoplasm of colon: Secondary | ICD-10-CM

## 2012-03-15 ENCOUNTER — Other Ambulatory Visit: Payer: Self-pay | Admitting: Women's Health

## 2012-03-15 DIAGNOSIS — Z1211 Encounter for screening for malignant neoplasm of colon: Secondary | ICD-10-CM

## 2012-06-30 ENCOUNTER — Ambulatory Visit (INDEPENDENT_AMBULATORY_CARE_PROVIDER_SITE_OTHER): Payer: Medicare Other | Admitting: Women's Health

## 2012-06-30 ENCOUNTER — Encounter: Payer: Self-pay | Admitting: Women's Health

## 2012-06-30 DIAGNOSIS — R319 Hematuria, unspecified: Secondary | ICD-10-CM | POA: Insufficient documentation

## 2012-06-30 LAB — URINALYSIS W MICROSCOPIC + REFLEX CULTURE
Casts: NONE SEEN
Crystals: NONE SEEN
Leukocytes, UA: NEGATIVE
Nitrite: NEGATIVE
Specific Gravity, Urine: 1.01 (ref 1.005–1.030)
pH: 5.5 (ref 5.0–8.0)

## 2012-06-30 NOTE — Patient Instructions (Addendum)
Hematuria, Adult  Hematuria (blood in your urine) can be caused by a bladder infection (cystitis), kidney infection (pyelonephritis), prostate infection (prostatitis), or kidney stone. Infections will usually respond to antibiotics (medications which kill germs), and a kidney stone will usually pass through your urine without further treatment. If you were put on antibiotics, take all the medicine until gone. You may feel better in a few days, but take all of your medicine or the infection may not respond and become more difficult to treat. If antibiotics were not given, an infection did not cause the blood in the urine. A further work up to find out the reason may be needed.  HOME CARE INSTRUCTIONS    Drink lots of fluid, 3 to 4 quarts a day. If you have been diagnosed with an infection, cranberry juice is especially recommended, in addition to large amounts of water.   Avoid caffeine, tea, and carbonated beverages, because they tend to irritate the bladder.   Avoid alcohol as it may irritate the prostate.   Only take over-the-counter or prescription medicines for pain, discomfort, or fever as directed by your caregiver.   If you have been diagnosed with a kidney stone follow your caregivers instructions regarding straining your urine to catch the stone.  TO PREVENT FURTHER INFECTIONS:   Empty the bladder often. Avoid holding urine for long periods of time.   After a bowel movement, women should cleanse front to back. Use each tissue only once.   Empty the bladder before and after sexual intercourse if you are a female.   Return to your caregiver if you develop back pain, fever, nausea (feeling sick to your stomach), vomiting, or your symptoms (problems) are not better in 3 days. Return sooner if you are getting worse.  If you have been requested to return for further testing make sure to keep your appointments. If an infection is not the cause of blood in your urine, X-rays may be required. Your caregiver  will discuss this with you.  SEEK IMMEDIATE MEDICAL CARE IF:    You have a persistent fever over 102 F (38.9 C).   You develop severe vomiting and are unable to keep the medication down.   You develop severe back or abdominal pain despite taking your medications.   You begin passing a large amount of blood or clots in your urine.   You feel extremely weak or faint, or pass out.  MAKE SURE YOU:    Understand these instructions.   Will watch your condition.   Will get help right away if you are not doing well or get worse.  Document Released: 04/07/2005 Document Revised: 06/30/2011 Document Reviewed: 11/25/2007  Exit[employer] Patient Information 2013 [employer].

## 2012-06-30 NOTE — Progress Notes (Signed)
Patient ID: Michelle Coleman, female   DOB: June 16, 1945, 68 y.o.   MRN: [id number] Presents with complaint of questionable vaginal bleeding versus hematuria. Had hematuria at annual exam at primary care one week ago with a negative urine culture. Noted some bright red spotting on pajama bottoms. History of lichen sclerosis uses Temovate. Denies abdominal pain, fever, pain, burning or discomfort with urination. Husband with history of bladder cancer.  Exam: Appears well, abdomen soft nontender, no CVAT, external genitalia small centimeter scabbed superficial cut/scratch on right inner labia appears to be healing. History of lichen sclerosis with pruritus. No active bleeding. Speculum exam, mild vaginal atrophy, cervix healthy with no visible blood, Q-tip into cervix, no visible blood. Bimanual no tenderness or fullness noted. UA: Large blood, 11-20 rbc's, microscopic exam performed on unconcentrated urine  Hematuria  Plan: Reviewed blood on pajama bottom most likely from small scratch to right inner labia. No visible blood vaginal wall or from the uterus.  Copy of UA given to take to appointment with Urologist. Reviewed if cystoscope negative, return to office for sonohysterogram. Urine culture pending.

## 2012-07-02 ENCOUNTER — Emergency Department (HOSPITAL_COMMUNITY): Payer: Medicare Other

## 2012-07-02 ENCOUNTER — Encounter (HOSPITAL_COMMUNITY): Payer: Self-pay | Admitting: Emergency Medicine

## 2012-07-02 ENCOUNTER — Observation Stay (HOSPITAL_COMMUNITY)
Admission: EM | Admit: 2012-07-02 | Discharge: 2012-07-03 | Disposition: A | Discharge status: Home / Self Care | Payer: Medicare Other | Attending: Internal Medicine | Admitting: Internal Medicine

## 2012-07-02 DIAGNOSIS — E669 Obesity, unspecified: Secondary | ICD-10-CM | POA: Diagnosis present

## 2012-07-02 DIAGNOSIS — K219 Gastro-esophageal reflux disease without esophagitis: Secondary | ICD-10-CM | POA: Diagnosis present

## 2012-07-02 DIAGNOSIS — I10 Essential (primary) hypertension: Secondary | ICD-10-CM | POA: Diagnosis present

## 2012-07-02 DIAGNOSIS — R319 Hematuria, unspecified: Secondary | ICD-10-CM | POA: Insufficient documentation

## 2012-07-02 DIAGNOSIS — R079 Chest pain, unspecified: Principal | ICD-10-CM | POA: Diagnosis present

## 2012-07-02 LAB — BASIC METABOLIC PANEL
CO2: 26 mEq/L (ref 19–32)
Calcium: 9.5 mg/dL (ref 8.4–10.5)
Chloride: 105 mEq/L (ref 96–112)
Glucose, Bld: 92 mg/dL (ref 70–99)
Sodium: 142 mEq/L (ref 135–145)

## 2012-07-02 LAB — URINE CULTURE: Colony Count: NO GROWTH

## 2012-07-02 LAB — POCT I-STAT TROPONIN I

## 2012-07-02 LAB — CBC
Hemoglobin: 12.8 g/dL (ref 12.0–15.0)
MCH: 29.4 pg (ref 26.0–34.0)
RBC: 4.35 MIL/uL (ref 3.87–5.11)

## 2012-07-02 MED ORDER — ACETAMINOPHEN 325 MG PO TABS: 650.0000 mg | ORAL_TABLET | ORAL | Status: DC | PRN

## 2012-07-02 MED ORDER — ONDANSETRON HCL 4 MG/2ML IJ SOLN
4.0000 mg | Freq: Once | INTRAMUSCULAR | Status: AC
  Administered 2012-07-02: 4 mg via INTRAVENOUS
  Filled 2012-07-02: qty 2

## 2012-07-02 MED ORDER — LISINOPRIL-HYDROCHLOROTHIAZIDE 20-25 MG PO TABS: 1.0000 | ORAL_TABLET | Freq: Every day | ORAL | Status: DC

## 2012-07-02 MED ORDER — ASPIRIN 81 MG PO CHEW
324.0000 mg | CHEWABLE_TABLET | Freq: Once | ORAL | Status: AC
  Administered 2012-07-02: 324 mg via ORAL
  Filled 2012-07-02: qty 4

## 2012-07-02 MED ORDER — ENOXAPARIN SODIUM 40 MG/0.4ML ~~LOC~~ SOLN
40.0000 mg | SUBCUTANEOUS | Status: DC
  Administered 2012-07-02: 40 mg via SUBCUTANEOUS
  Filled 2012-07-02 (×2): qty 0.4

## 2012-07-02 MED ORDER — HYDROCHLOROTHIAZIDE 25 MG PO TABS
25.0000 mg | ORAL_TABLET | Freq: Every day | ORAL | Status: DC
  Administered 2012-07-03: 25 mg via ORAL
  Filled 2012-07-02: qty 1

## 2012-07-02 MED ORDER — FENTANYL CITRATE 0.05 MG/ML IJ SOLN
50.0000 ug | Freq: Once | INTRAMUSCULAR | Status: AC
  Administered 2012-07-02: 50 ug via INTRAVENOUS
  Filled 2012-07-02: qty 2

## 2012-07-02 MED ORDER — ONDANSETRON HCL 4 MG/2ML IJ SOLN: 4.0000 mg | Freq: Four times a day (QID) | INTRAMUSCULAR | Status: DC | PRN

## 2012-07-02 MED ORDER — NITROGLYCERIN 0.4 MG SL SUBL
0.4000 mg | SUBLINGUAL_TABLET | SUBLINGUAL | Status: DC | PRN
  Administered 2012-07-02: 0.4 mg via SUBLINGUAL

## 2012-07-02 MED ORDER — LISINOPRIL 20 MG PO TABS
20.0000 mg | ORAL_TABLET | Freq: Every day | ORAL | Status: DC
  Administered 2012-07-03: 20 mg via ORAL
  Filled 2012-07-02: qty 1

## 2012-07-02 MED ORDER — PANTOPRAZOLE SODIUM 40 MG PO TBEC
40.0000 mg | DELAYED_RELEASE_TABLET | Freq: Every day | ORAL | Status: DC
  Administered 2012-07-03: 40 mg via ORAL
  Filled 2012-07-02: qty 1

## 2012-07-02 MED ORDER — ASPIRIN EC 81 MG PO TBEC
81.0000 mg | DELAYED_RELEASE_TABLET | Freq: Every day | ORAL | Status: DC
  Administered 2012-07-03: 81 mg via ORAL
  Filled 2012-07-02: qty 1

## 2012-07-02 MED ORDER — NITROGLYCERIN 0.4 MG SL SUBL: 0.4000 mg | SUBLINGUAL_TABLET | SUBLINGUAL | Status: DC | PRN

## 2012-07-02 NOTE — H&P (Signed)
Hospital Admission Note Date: 07/02/2012  Patient name: Michelle Coleman Medical record number: [id number] Date of birth: 1945/09/06 Age: 67 y.o. Gender: female PCP: Sid Falcon, MD  Medical Service: Internal Medicine Attending physician: Dr. Dalphine Handing    1st Contact: Dr. Shirlee Latch [phone] 2nd Contact: Dr. Everardo Beals [phone] After 5 pm or weekends: 1st Contact:  Pager: [phone] 2nd Contact: Pager: [phone]  Chief Complaint: chest pain  History of Present Illness: 67 y.o patient presents with chest pain with new onset 7 am while getting ready for work.  Location was left chest and between shoulder blades on her back.  No radiation.  At most the aching, pressure sensation (not sharp) was 5/10 (worst in her back) now 2/10. Pain was not sharp.  Patient reports feeling tired and weak and loose stool today (normally has loose stools with stress).  Denies fever, chills, appetite change, nausea, vomiting, abdominal pain, sob, sob with exertion, orthopnea, anxiety, swelling in lower extremities.  She saw her primary care physician today and was given a nitroglycerin and GI cocktail but did not have any relief.  She also took 325 Aspirin this am (does not take Aspirin daily) without relief. She's also notes her blood pressure to be up over last week or so noted by her PCP.  SBP fluctuating between 160s to >200s (i.e 207/100, 206,104).  This is also a new problem and she reports compliance with her lisinopril-HCTZ medication.  She denies increased salt intake.  She has a history of GERD and takes OTC Prilosec.  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       Meds: Current Outpatient Rx  Name  Route  Sig  Dispense  Refill  . clobetasol cream (TEMOVATE) 0.05 %   Topical   Apply 1 application topically daily as needed (vaginally).         Marland Kitchen lisinopril-hydrochlorothiazide (PRINZIDE,ZESTORETIC) 20-25 MG per tablet   Oral   Take 1 tablet  by mouth daily.           Marland Kitchen omeprazole (PRILOSEC OTC) 20 MG tablet   Oral   Take 20 mg by mouth daily.           . Probiotic Product (PROBIOTIC FORMULA PO)   Oral   Take 1 capsule by mouth daily.            Allergies: Allergies as of 07/02/2012 - Review Complete 07/02/2012  Allergen Reaction Noted  . Codeine Nausea And Vomiting 08/18/2008  . Morphine Nausea And Vomiting    Past Medical History  Diagnosis Date  . Hypertension   . GERD (gastroesophageal reflux disease)   . Diverticulosis   . Renal cyst     left  . Hx of cholecystectomy   . Skin abnormalities 03/2001    lichens sclerosis, kerotosis ( 11/2006)   Past Surgical History  Procedure Laterality Date  . Cholecystectomy  1992  . Rotator cuff repair  Jan 2012  . Bunionectomy  1984  . Tonsillectomy and adenoidectomy  1950  . Facial cyst removal  1983   Family History  Problem Relation Age of Onset  . Hypertension Mother   . Heart disease Mother   . Uterine cancer Maternal Grandmother   . Stomach cancer Maternal Grandfather   . Colon cancer Neg Hx   . Stroke Mother   . Heart disease Maternal Uncle    History   Social History  . Marital Status:  Married    Spouse Name: N/A    Number of Children: 2  . Years of Education: N/A   Occupational History  . CPA    Social History Main Topics  . Smoking status: Never Smoker   . Smokeless tobacco: Never Used  . Alcohol Use: No  . Drug Use: No  . Sexually Active: No   Other Topics Concern  . Not on file   Social History Narrative   Works as IT trainer.  Denies cigarettes, etOH, other drugs     Review of Systems: General: Denies fever, chills, appetite change, +weakness, +tired CV: denies swelling in lower extremities, +chest pain  Lungs: denies sob, sob with exertion, orthopnea Abdomen: denies nausea, vomiting, abdominal pain GU: +blood in urine (PCP aware), denies blood in stool, +1 loose stool today  Psychiatric: denies anxiety, +increased stress at  work  MSK: +back pain    Physical Exam: VS on exam BP 131/50, 100%, 66  Blood pressure 144/54, pulse 67, temperature 98.1 F (36.7 C), temperature source Oral, resp. rate 18, last menstrual period 02/13/1996, SpO2 97.00%. General: lying in bed, nad, alert and oriented x 3 HEENT: Hoskins/at, wet oral mucosa, perrl b/l  Cardiovascular: RRR, no murmurs  Pulmonary/Chest: ctab Abdominal: obese, ntnd, +nl bs Extremities: warm, no cyanosis or edema   Neurological: alert and oriented x 3, moving all 4 extremities  Lab results: Basic Metabolic Panel:  Recent Labs  30/86/57 1546  NA 142  K 3.5  CL 105  CO2 26  GLUCOSE 92  BUN 16  CREATININE 0.80  CALCIUM 9.5  CBC:  Recent Labs  07/02/12 1546  WBC 6.3  HGB 12.8  HCT 36.8  MCV 84.6  PLT 168  Urinalysis:  Recent Labs  06/30/12 0914  COLORURINE YELLOW  LABSPEC 1.010  PHURINE 5.5  GLUCOSEU NEG  HGBUR LARGE*  BILIRUBINUR NEG  KETONESUR NEG  PROTEINUR NEG  UROBILINOGEN 0.2  NITRITE NEG  LEUKOCYTESUR NEG   Misc. Labs: Cardiac enzymes, UDS   Imaging results:  Dg Chest 2 View  07/02/2012  *RADIOLOGY REPORT*  Clinical Data: Chest pain.  CHEST - 2 VIEW  Comparison: Chest 09/04/2008.  Findings: Lungs are clear.  Heart size is normal.  No pneumothorax or pleural fluid.  IMPRESSION: Negative chest.   Original Report Authenticated By: Holley Dexter, M.D.     Other results: EKG: normal sinus rhythm. No ST changes   Assessment & Plan by Problem: 67 y.o female PMH HTN, GERD admitted for chest pain rule out.   1. Chest pain -Working up for cardiac verus noncardiac etiology.  Pulmonary embolus less likely as no evidence of sinus tachycardia and she is saturating well on room air.  Symptoms are not relieved by GI cocktail or NTG.  She has recently had uncontrolled HTN over 1 week.   -timi score 2 (low risk)  -She does not tolerate morphine so she was given Fentanyl 50 mcg for pain, Zofran x 1, ASA 324 mg x 1, NTG x 1 in the  ED -will trend cardiac enzymes (trop i negative x 1)   -pending UDS, TSH, troponin to trend, proBNP, HA1C, lipid panel  2. HTN -will monitor BP. BP 180/92 12:41 PM -resumed home BP medications Lisinopril-HCTZ 20-25 mg   3. Hematuria -PCP aware being worked up outpatient.   4. F/E/N -will monitor electrolytes -cardiac diet.   5. DVT px  -Lovenox   Dispo: Admit to telemetry for chest pain rule out.  Discharge in 1-2 days likely  The patient does have a current PCP (KALISH, MICHAEL J, MD), therefore will not be requiring OPC follow-up after discharge.   The patient does not have transportation limitations that hinder transportation to clinic appointments.  SignedAnnett Gula [phone] 07/02/2012, 6:52 PM

## 2012-07-02 NOTE — ED Notes (Signed)
Per ems-- from dr. Isidore Moos. Pt c/o chest pressure during the night- upper left radiation to shoulder blades with generalized weakness. Pt denies nv. Skin warm and dry. Pt took 250mg  of asa this am. 0.4mg  of NTG with no relief. Pain initially to 5 that has gradually reduced to 2/10. Pt states MD found blood in urine last week, but has not found source. 12 lead unremarkable. Pt htn 206/104 at MD office.

## 2012-07-02 NOTE — ED Provider Notes (Signed)
History     CSN: [id number]  Arrival date & time 07/02/12  1331   First MD Initiated Contact with Patient 07/02/12 1456      Chief Complaint  Patient presents with  . Chest Pain    (Consider location/radiation/quality/duration/timing/severity/associated sxs/prior treatment) HPI Comments: Patient presents with a one-day history of chest pain. She describes as a tightness it also radiates between her shoulder blades. It is located on the left side of her chest. She denies any shortness of breath. She denies any nausea vomiting. She denies any diaphoresis. She states that the shoulder blade discomfort has been there since she woke up about 5:30 this morning. It's not related to movement. It's not related to breathing. The tightness in her anterior chest wall is been intermittent. She saw her primary care physician and took a nitroglycerin but did not have any relief with this. She's also noted her blood pressure to be up over last week or so.  Patient is a 67 y.o. female presenting with chest pain.  Chest Pain Associated symptoms: no abdominal pain, no back pain, no cough, no diaphoresis, no dizziness, no fatigue, no fever, no headache, no nausea, no numbness, no shortness of breath, not vomiting and no weakness     Past Medical History  Diagnosis Date  . Hypertension   . GERD (gastroesophageal reflux disease)   . Diverticulosis   . Renal cyst     left  . Hx of cholecystectomy   . Skin abnormalities 03/2001    lichens sclerosis, kerotosis ( 11/2006)    Past Surgical History  Procedure Laterality Date  . Cholecystectomy  1992  . Rotator cuff repair  Jan 2012  . Bunionectomy  1984  . Tonsillectomy and adenoidectomy  1950  . Facial cyst removal  1983    Family History  Problem Relation Age of Onset  . Hypertension Mother   . Heart disease Mother   . Uterine cancer Maternal Grandmother   . Stomach cancer Maternal Grandfather   . Colon cancer Neg Hx     History  Substance  Use Topics  . Smoking status: Never Smoker   . Smokeless tobacco: Never Used  . Alcohol Use: No    OB History   Grav Para Term Preterm Abortions TAB SAB Ect Mult Living   2 2        2       Review of Systems  Constitutional: Negative for fever, chills, diaphoresis and fatigue.  HENT: Negative for congestion, rhinorrhea and sneezing.   Eyes: Negative.   Respiratory: Negative for cough, chest tightness and shortness of breath.   Cardiovascular: Positive for chest pain. Negative for leg swelling.  Gastrointestinal: Negative for nausea, vomiting, abdominal pain, diarrhea and blood in stool.  Genitourinary: Negative for frequency, hematuria, flank pain and difficulty urinating.  Musculoskeletal: Negative for back pain and arthralgias.  Skin: Negative for rash.  Neurological: Negative for dizziness, speech difficulty, weakness, numbness and headaches.    Allergies  Codeine and Morphine  Home Medications   Current Outpatient Rx  Name  Route  Sig  Dispense  Refill  . clobetasol cream (TEMOVATE) 0.05 %   Topical   Apply 1 application topically daily as needed (vaginally).         Marland Kitchen lisinopril-hydrochlorothiazide (PRINZIDE,ZESTORETIC) 20-25 MG per tablet   Oral   Take 1 tablet by mouth daily.           Marland Kitchen omeprazole (PRILOSEC OTC) 20 MG tablet   Oral  Take 20 mg by mouth daily.           . Probiotic Product (PROBIOTIC FORMULA PO)   Oral   Take 1 capsule by mouth daily.            BP 143/70  Pulse 65  Temp(Src) 98.1 F (36.7 C) (Oral)  Resp 20  SpO2 100%  LMP 02/13/1996  Physical Exam  Constitutional: She is oriented to person, place, and time. She appears well-developed and well-nourished.  HENT:  Head: Normocephalic and atraumatic.  Eyes: Pupils are equal, round, and reactive to light.  Neck: Normal range of motion. Neck supple.  Cardiovascular: Normal rate, regular rhythm and normal heart sounds.   Pulmonary/Chest: Effort normal and breath sounds  normal. No respiratory distress. She has no wheezes. She has no rales. She exhibits no tenderness.  Abdominal: Soft. Bowel sounds are normal. There is no tenderness. There is no rebound and no guarding.  Musculoskeletal: Normal range of motion. She exhibits no edema and no tenderness.  Lymphadenopathy:    She has no cervical adenopathy.  Neurological: She is alert and oriented to person, place, and time.  Skin: Skin is warm and dry. No rash noted.  Psychiatric: She has a normal mood and affect.    ED Course  Procedures (including critical care time)  Results for orders placed during the hospital encounter of 07/02/12  CBC      Result Value Range   WBC 6.3  4.0 - 10.5 K/uL   RBC 4.35  3.87 - 5.11 MIL/uL   Hemoglobin 12.8  12.0 - 15.0 g/dL   HCT 16.1  09.6 - 04.5 %   MCV 84.6  78.0 - 100.0 fL   MCH 29.4  26.0 - 34.0 pg   MCHC 34.8  30.0 - 36.0 g/dL   RDW 40.9  81.1 - 91.4 %   Platelets 168  150 - 400 K/uL  BASIC METABOLIC PANEL      Result Value Range   Sodium 142  135 - 145 mEq/L   Potassium 3.5  3.5 - 5.1 mEq/L   Chloride 105  96 - 112 mEq/L   CO2 26  19 - 32 mEq/L   Glucose, Bld 92  70 - 99 mg/dL   BUN 16  6 - 23 mg/dL   Creatinine, Ser 7.82  0.50 - 1.10 mg/dL   Calcium 9.5  8.4 - 95.6 mg/dL   GFR calc non Af Amer 75 (*) >90 mL/min   GFR calc Af Amer 87 (*) >90 mL/min  POCT I-STAT TROPONIN I      Result Value Range   Troponin i, poc 0.00  0.00 - 0.08 ng/mL   Comment 3            Dg Chest 2 View  07/02/2012  *RADIOLOGY REPORT*  Clinical Data: Chest pain.  CHEST - 2 VIEW  Comparison: Chest 09/04/2008.  Findings: Lungs are clear.  Heart size is normal.  No pneumothorax or pleural fluid.  IMPRESSION: Negative chest.   Original Report Authenticated By: Holley Dexter, M.D.       Date: 07/02/2012  Rate: 64  Rhythm: normal sinus rhythm  QRS Axis: normal  Intervals: normal  ST/T Wave abnormalities: nonspecific ST/T changes, very slight ST depression laterally   Conduction Disutrbances:none  Narrative Interpretation:   Old EKG Reviewed: none available    1. Chest pain       MDM  Patient with chest pain and pressure behind her shoulder blades. She  has no symptoms suggestive of pulmonary embolus. Her oxygen saturations are normal. Her symptoms are not relieved with nitroglycerin. She was given baby aspirin. It did not sound suggestive of aortic dissection. He did feel given her history of hypertension and his description of pain that she should be admitted for further cardiac evaluation and I discussed the findings with the internal medicine teaching service who is on call for unassigned patients who will admit the patient        Rolan Bucco, MD 07/02/12 1732

## 2012-07-03 DIAGNOSIS — R079 Chest pain, unspecified: Principal | ICD-10-CM

## 2012-07-03 LAB — HEMOGLOBIN A1C: Mean Plasma Glucose: 105 mg/dL (ref <117)

## 2012-07-03 LAB — LIPID PANEL
LDL Cholesterol: 92 mg/dL (ref 0–99)
Total CHOL/HDL Ratio: 4.5 RATIO
VLDL: 31 mg/dL (ref 0–40)

## 2012-07-03 LAB — RAPID URINE DRUG SCREEN, HOSP PERFORMED
Cocaine: NOT DETECTED
Opiates: NOT DETECTED

## 2012-07-03 LAB — TSH: TSH: 3.289 u[IU]/mL (ref 0.350–4.500)

## 2012-07-03 LAB — TROPONIN I: Troponin I: <0.3 ng/mL (ref <0.30)

## 2012-07-03 MED ORDER — ASPIRIN 81 MG PO TBEC: 81.0000 mg | DELAYED_RELEASE_TABLET | Freq: Every day | ORAL | Status: DC

## 2012-07-03 NOTE — H&P (Signed)
Internal Medicine Teaching Service Attending Note Date: 07/03/2012  Patient name: Michelle Coleman  Medical record number: [id number]  Date of birth: Dec 07, 1945   Brief History: The patient, Michelle Coleman, is a 67 y.o. year old female, with past medical history of uncontrolled HTN and GERD, who comes in with the chief  complaint of chest pressure. I have read the history documented by Dr.McLean and I concur with the chronology of events.   When I met with the patient today, she seemed comfortable and rested. Her husband was by her side. She did not have any overnight chest pain and she was helped by fentanyl that she received last night. In brief, the patient had a sensation of exhaustion yesterday morning after waking up, even though she had had a restful sleep. She measure her blood pressure which was in the range of 200s/100s and kept on increasing despite her being at rest. She got anxious and felt chest pressure. She called her doctor who prescribed nitroglycerine which decreased the blood pressure but did not decrease the pressure sensation. She confirmed that she has never had such a pressure like sensation before and it felt different than what she usually feels in GERD pain. She is compliant on lisinopril-HCTZ and has not started any new medications. She has no other complains to offer at this time.   Past medical history, social history and medications have been reviewed. 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   Review of systems as per HPI and resident note.    Filed Vitals:   07/02/12 1900 07/02/12 2000 07/02/12 2043 07/03/12 0451  BP: 121/47 148/60 151/82 131/83  Pulse: 67 66 68 63  Temp:   98 F (36.7 C) 98 F (36.7 C)  TempSrc:   Oral Oral  Resp:   18 18  Height:   5' 4.5" (1.638 m)   Weight:   213 lb 1.6 oz (96.662 kg) 213 lb 1.6 oz (96.662 kg)  SpO2: 96% 98% 100% 93%   Vitals reviewed.  General: Resting in bed. HEENT: PERRL, EOMI, no scleral  icterus. Heart: RRR, no rubs, murmurs or gallops. Lungs: Clear to auscultation bilaterally, no wheezes, rales, or rhonchi. Abdomen: Soft, nontender, nondistended, BS present. Extremities: Warm, no pedal edema. Neuro: Alert and oriented X3, cranial nerves II-XII grossly intact,  strength and sensation to light touch equal in bilateral upper and lower extremities   Recent Labs Lab 07/02/12 1546  NA 142  K 3.5  CL 105  CO2 26  GLUCOSE 92  BUN 16  CREATININE 0.80  CALCIUM 9.5    Recent Labs Lab 07/02/12 1546  HGB 12.8  HCT 36.8  WBC 6.3  PLT 168   Lipid Panel     Component Value Date/Time   CHOL 158 07/03/2012 0455   TRIG 156* 07/03/2012 0455   HDL 35* 07/03/2012 0455   CHOLHDL 4.5 07/03/2012 0455   VLDL 31 07/03/2012 0455   LDLCALC 92 07/03/2012 0455       Recent Labs Lab 07/02/12 2135 07/03/12 0455  TROPONINI <0.30 <0.30   Urinalysis    Component Value Date/Time   COLORURINE YELLOW 06/30/2012 0914   APPEARANCEUR CLEAR 06/30/2012 0914   LABSPEC 1.010 06/30/2012 0914   PHURINE 5.5 06/30/2012 0914   GLUCOSEU NEG 06/30/2012 0914   HGBUR LARGE* 06/30/2012 0914   BILIRUBINUR NEG 06/30/2012 0914   KETONESUR NEG 06/30/2012 0914   PROTEINUR NEG 06/30/2012 0914   UROBILINOGEN 0.2 06/30/2012 0914  NITRITE NEG 06/30/2012 0914   LEUKOCYTESUR NEG 06/30/2012 0914   Drugs of Abuse     Component Value Date/Time   LABOPIA NONE DETECTED 07/02/2012 2329   COCAINSCRNUR NONE DETECTED 07/02/2012 2329   LABBENZ NONE DETECTED 07/02/2012 2329   AMPHETMU NONE DETECTED 07/02/2012 2329   THCU NONE DETECTED 07/02/2012 2329   LABBARB NONE DETECTED 07/02/2012 2329    EKG: NSR @ 62. It does not show any changes significant for Acute MI at this time.    Marland Kitchen aspirin EC  81 mg Oral Daily  . enoxaparin (LOVENOX) injection  40 mg Subcutaneous Q24H  . lisinopril  20 mg Oral Daily   And  . hydrochlorothiazide  25 mg Oral Daily  . pantoprazole  40 mg Oral Daily    Assessment:   - Atypical Chest  pain of uncertain etiology - Uncontrolled hypertension - GERD - Microscopic Heamturia  Please follow the detailed management plan in the resident note from today. I have met with my team and discussed the care of the patient with them in detail. Here are certain additions:   1. Chest pain : Atypical angina, likely due to uncontrolled hypertension, dyslipidemia stress. Underlying GERD could play a role. Cardiac enzymes negative. EKG normal. Outpatient stress test, beta blockers to be considered if chest pains recur. Agree with aspirin, consider statin. Consider weight loss as outpatient. Patient non-smoker. Her TIMI is 2.   2. Hypertension: Has been controlled or moderately elevated this admission. However per patient, her BP has been in 200s. Advisable she continues to monitor her BP and start a second medicine with her PCP, BB can be considered with caution because her pulse rate is 60-65 pm.  3. GERD: Continue with PPI.   BHARDWAJ, SHILPA 07/03/2012, 10:18 AM.

## 2012-07-03 NOTE — Discharge Summary (Signed)
Internal Medicine Teaching [facility] Discharge Note  Name: Michelle Coleman MRN: [id number] DOB: 21-Aug-1945 67 y.o.  Date of Admission: 07/02/2012  1:31 PM Date of Discharge: 07/03/2012 Attending Physician: Aletta Edouard, MD  Discharge Diagnosis: 1.  Chest pain 2.  HYPERTENSION 3. Hematuria  4.  History of GERD  Discharge Medications:   Medication List    TAKE these medications       aspirin 81 MG EC tablet  Take 1 tablet (81 mg total) by mouth daily.     clobetasol cream 0.05 %  Commonly known as:  TEMOVATE  Apply 1 application topically daily as needed (vaginally).     lisinopril-hydrochlorothiazide 20-25 MG per tablet  Commonly known as:  PRINZIDE,ZESTORETIC  Take 1 tablet by mouth daily.     omeprazole 20 MG tablet  Commonly known as:  PRILOSEC OTC  Take 20 mg by mouth daily.     PROBIOTIC FORMULA PO  Take 1 capsule by mouth daily.        Disposition and follow-up:   Ms.Michelle Coleman was discharged from [facility] in stable condition.  At the hospital follow up visit please address  1) Refer for outpatient stress test 2) follow up hematuria 3) Titrate blood pressure medications up if needed  4) Consider addition of beta blocker if chest pain reoccurs. 5) Consider starting a low dose statin and review with the patient   Follow-up Appointments:  Discharge Orders   Future Orders Complete By Expires     Diet - low sodium heart healthy  As directed     Discharge instructions  As directed     Comments:      1) Please read all discharge paperwork 2) Call Dr. Leonette Most to make an appointment for next week to see him.  He will need to refer your for an outpatient stress test    Increase activity slowly  As directed        Consultations: none   Procedures Performed:  Dg Chest 2 View  07/02/2012  *RADIOLOGY REPORT*  Clinical Data: Chest pain.  CHEST - 2 VIEW  Comparison: Chest 09/04/2008.  Findings: Lungs are clear.  Heart size is normal.  No  pneumothorax or pleural fluid.  IMPRESSION: Negative chest.   Original Report Authenticated By: Holley Dexter, M.D.     Admission HPI:  Chief Complaint: chest pain  History of Present Illness:  67 y.o patient presents with chest pain with new onset 7 am while getting ready for work. Location was left chest and between shoulder blades on her back. No radiation. At most the aching, pressure sensation (not sharp) was 5/10 (worst in her back) now 2/10. Pain was not sharp. Patient reports feeling tired and weak and loose stool today (normally has loose stools with stress). Denies fever, chills, appetite change, nausea, vomiting, abdominal pain, sob, sob with exertion, orthopnea, anxiety, swelling in lower extremities. She saw her primary care physician today and was given a nitroglycerin and GI cocktail but did not have any relief. She also took 325 Aspirin this am (does not take Aspirin daily) without relief. She's also notes her blood pressure to be up over last week or so noted by her PCP. SBP fluctuating between 160s to >200s (i.e 207/100, 206,104). This is also a new problem and she reports compliance with her lisinopril-HCTZ medication. She denies increased salt intake. She has a history of GERD and takes OTC Prilosec. 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     Review of Systems:  General: Denies fever, chills, appetite change, +weakness, +tired  CV: denies swelling in lower extremities, +chest pain  Lungs: denies sob, sob with exertion, orthopnea  Abdomen: denies nausea, vomiting, abdominal pain  GU: +blood in urine (PCP aware), denies blood in stool, +1 loose stool today  Psychiatric: denies anxiety, +increased stress at work  MSK: +back pain   Physical Exam:  VS on exam BP 131/50, 100%, 66  Blood pressure 144/54, pulse 67, temperature 98.1 F (36.7 C), temperature source Oral, resp. rate 18, last  menstrual period 02/13/1996, SpO2 97.00%.  General: lying in bed, nad, alert and oriented x 3  HEENT: [place]/at, wet oral mucosa, perrl b/l  Cardiovascular: RRR, no murmurs  Pulmonary/Chest: ctab  Abdominal: obese, ntnd, +nl bs Extremities: warm, no cyanosis or edema  Neurological: alert and oriented x 3, moving all 4 extremities    Hospital Course by problem list: 1.  Chest pain 2.  HYPERTENSION 3. Hematuria  4.  History of GERD  67 y.o female PMH HTN, GERD admitted for chest pain rule out.   1. Chest pain  Unclear etiology. Could be related to noncardiac versus anxiety/stress versus uncontrolled hypertension over the last week. Less likely pulmonary embolus because no sinus tachycardia and saturating well.  CXR normal and did not indicate findings of dissection.  Enzymes negative x 3. GI cocktail or NTG did not relieve symptoms but Fentanyl intravenous helped in the ED.  Timi score 2 (low risk), negative Urine Drug Screen, normal TSH, mildly elevated proBNP, HA1C normal, lipid panel TG 156 LDL 92, HDL 35 (low).  Will notify PCP to refer for outpatient stress test.  Started on Aspirin 81 mg daily. Consider addition of beta blocker if chest pain reoccurs.    2. HTN  More controlled this am since admission.  EMS had reading of 180/92 and patient admits over the last week home readings have been >200/>100.  We resumed home medications Lisinopril-HCTZ 20-25 mg.  PCP will need to monitor outpatient and add additional medications if indicated.    3. Hematuria  PCP aware and patient being worked up outpatient.   4. History of GERD Given Protonix this admission to resume home PPI.    She was given Lovenox for DVT prophylaxis.    Discharge Vitals:  BP 131/83  Pulse 63  Temp(Src) 98 F (36.7 C) (Oral)  Resp 18  Ht 5' 4.5" (1.638 m)  Wt 213 lb 1.6 oz (96.662 kg)  BMI 36.03 kg/m2  SpO2 93%  LMP 02/13/1996 Discharge physical exam:  General: lying in bed, nad, alert and oriented x 3   HEENT: [place]/at  CV: RRR no murmurs  Lungs: ctab  Ab: soft, obese, ntnd, nl bs  Extremities: warm, no cyanosis or edema  Neuro: moving all 4 extremities. Alert and oriented x 3  Discharge Labs:  Results for SIGOURNEY, PORTILLO (MRN [id number]) as of 07/03/2012 10:10  Ref. Range 07/02/2012 15:46 07/02/2012 16:02 07/02/2012 21:35 07/03/2012 04:55  Sodium Latest Range: 135-145 mEq/L 142     Potassium Latest Range: 3.5-5.1 mEq/L 3.5     Chloride Latest Range: 96-112 mEq/L 105     CO2 Latest Range: 19-32 mEq/L 26     Mean Plasma Glucose Latest Range: <117 mg/dL   130   BUN Latest Range: 6-23 mg/dL 16     Creatinine Latest Range: 0.50-1.10 mg/dL  0.80     Calcium Latest Range: 8.4-10.5 mg/dL 9.5     GFR calc non Af Amer Latest Range: >90 mL/min 75 (L)     GFR calc Af Amer Latest Range: >90 mL/min 87 (L)     Glucose Latest Range: 70-99 mg/dL 92     Troponin I Latest Range: <0.30 ng/mL   <0.30 <0.30  Troponin i, poc Latest Range: 0.00-0.08 ng/mL  0.00    Pro B Natriuretic peptide (BNP) Latest Range: 0-125 pg/mL   207.1 (H)   Cholesterol Latest Range: 0-200 mg/dL    846  Triglycerides Latest Range: <150 mg/dL    962 (H)  HDL Latest Range: >39 mg/dL    35 (L)  LDL (calc) Latest Range: 0-99 mg/dL    92  VLDL Latest Range: 0-40 mg/dL    31  Total CHOL/HDL Ratio No range found    4.5   Results for CERI, MAYER (MRN [id number]) as of 07/03/2012 10:10  Ref. Range 07/02/2012 15:46  WBC Latest Range: 4.0-10.5 K/uL 6.3  RBC Latest Range: 3.87-5.11 MIL/uL 4.35  Hemoglobin Latest Range: 12.0-15.0 g/dL 40.1  HCT Latest Range: 36.0-46.0 % 36.8  MCV Latest Range: 78.0-100.0 fL 84.6  MCH Latest Range: 26.0-34.0 pg 29.4  MCHC Latest Range: 30.0-36.0 g/dL 02.7  RDW Latest Range: 11.5-15.5 % 14.0  Platelets Latest Range: 150-400 K/uL 168   Results for IVAH, GIRARDOT (MRN [id number]) as of 07/03/2012 10:10  Ref. Range 07/02/2012 21:35  Hemoglobin A1C Latest Range: <5.7 % 5.3  TSH Latest Range: 0.350-4.500 uIU/mL 3.289    Results for TACARRA, JUSTO (MRN [id number]) as of 07/03/2012 10:10  Ref. Range 07/02/2012 23:29  AMPHETAMINES Latest Range: NONE DETECTED  NONE DETECTED  Barbiturates Latest Range: NONE DETECTED  NONE DETECTED  Benzodiazepines Latest Range: NONE DETECTED  NONE DETECTED  Opiates Latest Range: NONE DETECTED  NONE DETECTED  COCAINE Latest Range: NONE DETECTED  NONE DETECTED  Tetrahydrocannabinol Latest Range: NONE DETECTED  NONE DETECTED   Results for orders placed during the hospital encounter of 07/02/12 (from the past 24 hour(s))  CBC     Status: None   Collection Time    07/02/12  3:46 PM      Result Value Range   WBC 6.3  4.0 - 10.5 K/uL   RBC 4.35  3.87 - 5.11 MIL/uL   Hemoglobin 12.8  12.0 - 15.0 g/dL   HCT 87.5  64.3 - 32.9 %   MCV 84.6  78.0 - 100.0 fL   MCH 29.4  26.0 - 34.0 pg   MCHC 34.8  30.0 - 36.0 g/dL   RDW 51.8  84.1 - 66.0 %   Platelets 168  150 - 400 K/uL  BASIC METABOLIC PANEL     Status: Abnormal   Collection Time    07/02/12  3:46 PM      Result Value Range   Sodium 142  135 - 145 mEq/L   Potassium 3.5  3.5 - 5.1 mEq/L   Chloride 105  96 - 112 mEq/L   CO2 26  19 - 32 mEq/L   Glucose, Bld 92  70 - 99 mg/dL   BUN 16  6 - 23 mg/dL   Creatinine, Ser 6.30  0.50 - 1.10 mg/dL   Calcium 9.5  8.4 - 16.0 mg/dL   GFR calc non Af Amer 75 (*) >90 mL/min   GFR calc Af Amer 87 (*) >90 mL/min  POCT I-STAT TROPONIN I     Status: None  Collection Time    07/02/12  4:02 PM      Result Value Range   Troponin i, poc 0.00  0.00 - 0.08 ng/mL   Comment 3           TROPONIN I     Status: None   Collection Time    07/02/12  9:35 PM      Result Value Range   Troponin I <0.30  <0.30 ng/mL  TSH     Status: None   Collection Time    07/02/12  9:35 PM      Result Value Range   TSH 3.289  0.350 - 4.500 uIU/mL  PRO B NATRIURETIC PEPTIDE     Status: Abnormal   Collection Time    07/02/12  9:35 PM      Result Value Range   Pro B Natriuretic peptide (BNP) 207.1 (*) 0 - 125  pg/mL  HEMOGLOBIN A1C     Status: None   Collection Time    07/02/12  9:35 PM      Result Value Range   Hemoglobin A1C 5.3  <5.7 %   Mean Plasma Glucose 105  <117 mg/dL  URINE RAPID DRUG SCREEN (HOSP PERFORMED)     Status: None   Collection Time    07/02/12 11:29 PM      Result Value Range   Opiates NONE DETECTED  NONE DETECTED   Cocaine NONE DETECTED  NONE DETECTED   Benzodiazepines NONE DETECTED  NONE DETECTED   Amphetamines NONE DETECTED  NONE DETECTED   Tetrahydrocannabinol NONE DETECTED  NONE DETECTED   Barbiturates NONE DETECTED  NONE DETECTED  TROPONIN I     Status: None   Collection Time    07/03/12  4:55 AM      Result Value Range   Troponin I <0.30  <0.30 ng/mL  LIPID PANEL     Status: Abnormal   Collection Time    07/03/12  4:55 AM      Result Value Range   Cholesterol 158  0 - 200 mg/dL   Triglycerides 161 (*) <150 mg/dL   HDL 35 (*) >09 mg/dL   Total CHOL/HDL Ratio 4.5     VLDL 31  0 - 40 mg/dL   LDL Cholesterol 92  0 - 99 mg/dL    Signed: Annett Gula 07/03/2012, 10:18 AM   Time Spent on Discharge: <31 minutes  Services Ordered on Discharge: none Equipment Ordered on Discharge: none

## 2012-07-03 NOTE — Progress Notes (Signed)
Subjective: Patient denies chest sensation since 8:30 last night.  Denies n/v/abdominal pain.    Objective: Vital signs in last 24 hours: Filed Vitals:   07/02/12 1900 07/02/12 2000 07/02/12 2043 07/03/12 0451  BP: 121/47 148/60 151/82 131/83  Pulse: 67 66 68 63  Temp:   98 F (36.7 C) 98 F (36.7 C)  TempSrc:   Oral Oral  Resp:   18 18  Height:   5' 4.5" (1.638 m)   Weight:   213 lb 1.6 oz (96.662 kg) 213 lb 1.6 oz (96.662 kg)  SpO2: 96% 98% 100% 93%   General: lying in bed, nad, alert and oriented x 3 HEENT: Tea/at CV: RRR no murmurs  Lungs: ctab Ab: soft, obese, ntnd, nl bs  Extremities: warm, no cyanosis or edema  Neuro: moving all 4 extremities. Alert and oriented x 3   Lab Results: Basic Metabolic Panel:  Recent Labs Lab 07/02/12 1546  NA 142  K 3.5  CL 105  CO2 26  GLUCOSE 92  BUN 16  CREATININE 0.80  CALCIUM 9.5   CBC:  Recent Labs Lab 07/02/12 1546  WBC 6.3  HGB 12.8  HCT 36.8  MCV 84.6  PLT 168   Cardiac Enzymes:  Recent Labs Lab 07/02/12 2135 07/03/12 0455  TROPONINI <0.30 <0.30   BNP:  Recent Labs Lab 07/02/12 2135  PROBNP 207.1*   Hemoglobin A1C:  Recent Labs Lab 07/02/12 2135  HGBA1C 5.3   Fasting Lipid Panel:  Recent Labs Lab 07/03/12 0455  CHOL 158  HDL 35*  LDLCALC 92  TRIG 621*  CHOLHDL 4.5   Thyroid Function Tests:  Recent Labs Lab 07/02/12 2135  TSH 3.289   Urine Drug Screen: Drugs of Abuse     Component Value Date/Time   LABOPIA NONE DETECTED 07/02/2012 2329   COCAINSCRNUR NONE DETECTED 07/02/2012 2329   LABBENZ NONE DETECTED 07/02/2012 2329   AMPHETMU NONE DETECTED 07/02/2012 2329   THCU NONE DETECTED 07/02/2012 2329   LABBARB NONE DETECTED 07/02/2012 2329    Urinalysis:  Recent Labs Lab 06/30/12 0914  COLORURINE YELLOW  LABSPEC 1.010  PHURINE 5.5  GLUCOSEU NEG  HGBUR LARGE*  BILIRUBINUR NEG  KETONESUR NEG  PROTEINUR NEG  UROBILINOGEN 0.2  NITRITE NEG  LEUKOCYTESUR NEG   Misc.  Labs: none  Micro Results: Recent Results (from the past 240 hour(s))  URINE CULTURE     Status: None   Collection Time    06/30/12  9:14 AM      Result Value Range Status   Colony Count NO GROWTH   Final   Organism ID, Bacteria NO GROWTH   Final   Studies/Results: Dg Chest 2 View  07/02/2012  *RADIOLOGY REPORT*  Clinical Data: Chest pain.  CHEST - 2 VIEW  Comparison: Chest 09/04/2008.  Findings: Lungs are clear.  Heart size is normal.  No pneumothorax or pleural fluid.  IMPRESSION: Negative chest.   Original Report Authenticated By: Holley Dexter, M.D.    Medications:  Scheduled Meds: . aspirin EC  81 mg Oral Daily  . enoxaparin (LOVENOX) injection  40 mg Subcutaneous Q24H  . lisinopril  20 mg Oral Daily   And  . hydrochlorothiazide  25 mg Oral Daily  . pantoprazole  40 mg Oral Daily   Continuous Infusions:  PRN Meds:.acetaminophen, nitroGLYCERIN, ondansetron (ZOFRAN) IV Assessment/Plan:  67 y.o female PMH HTN, GERD admitted for chest pain rule out.   1. Chest pain  -Unclear etiology.  Could be related to noncardiac versus  anxiety versus uncontrolled HTN.  Enzymes negative x 3.  GI cocktail or NTG did not relieve symptoms but Fentanyl iv helped in the ED.  -She has recently had uncontrolled HTN over 1 week.  -timi score 2 (low risk)  -negative UDS, wnl TSH, mildly elevated proBNP, HA1C normal,  lipid panel TG 156 LDL 92, HDL 35 (low)  -will notify PCP to refer for outpatient stress test  2. HTN  -more controlled this am since admission  -resumed home BP medications Lisinopril-HCTZ 20-25 mg  -PCP will need to monitor outpatient and add additional medications if indicated   3. Hematuria  -PCP aware being worked up outpatient.   4. F/E/N  -will monitor electrolytes  -cardiac diet.   5. DVT px  -Lovenox   Dispo: d/c today with follow up with Dr. Leonette Most in 1 week.   The patient does have a current PCP (KALISH, MICHAEL J, MD), therefore will not be requiring OPC  follow-up after discharge.  The patient does not have transportation limitations that hinder transportation to clinic appointments.   .Services Needed at time of discharge: Y = Yes, Blank = No PT:   OT:   RN:   Equipment:   Other:     LOS: 1 day   Michelle Coleman [phone] 07/03/2012, 10:06 AM

## 2012-07-04 NOTE — Discharge Summary (Signed)
Internal Medicine Teaching Service Attending Note Date: 07/04/2012  Patient name: Michelle Coleman  Medical record number: [id number]  Date of birth: [birth year]   I agree with resident discharge summary. Please see my note from today and DC summary by the resident for details of care.    Siaosi Alter 07/04/2012, 2:40 PM.

## 2012-07-05 ENCOUNTER — Other Ambulatory Visit: Payer: Self-pay | Admitting: Women's Health

## 2012-07-05 DIAGNOSIS — R319 Hematuria, unspecified: Secondary | ICD-10-CM

## 2012-07-16 ENCOUNTER — Ambulatory Visit (INDEPENDENT_AMBULATORY_CARE_PROVIDER_SITE_OTHER): Payer: Medicare Other | Admitting: Women's Health

## 2012-07-16 ENCOUNTER — Ambulatory Visit (INDEPENDENT_AMBULATORY_CARE_PROVIDER_SITE_OTHER): Payer: Medicare Other

## 2012-07-16 ENCOUNTER — Encounter: Payer: Self-pay | Admitting: Women's Health

## 2012-07-16 DIAGNOSIS — R319 Hematuria, unspecified: Secondary | ICD-10-CM

## 2012-07-16 NOTE — Progress Notes (Signed)
Patient ID: Michelle Coleman, female   DOB: 05/13/1945, 67 y.o.   MRN: [id number] Presents for ultrasound, having hematuria and has followup with urologist 07/20/11. Denies any urinary symptoms or visible blood in urine.  Ultrasound: no abnormalities noted. Normal uterus and ovaries. Endometrium 3.3 mm.  Plan:Copy of ultrasound given to patient to take to urology appointment. Reviewed very unlikely bleeding occurring from uterus, endometrium thin.

## 2013-08-19 DIAGNOSIS — N289 Disorder of kidney and ureter, unspecified: Secondary | ICD-10-CM

## 2013-08-19 HISTORY — DX: Disorder of kidney and ureter, unspecified: N28.9

## 2013-12-21 ENCOUNTER — Ambulatory Visit (INDEPENDENT_AMBULATORY_CARE_PROVIDER_SITE_OTHER): Payer: Medicare Other | Admitting: Cardiology

## 2013-12-21 ENCOUNTER — Encounter: Payer: Self-pay | Admitting: Cardiology

## 2013-12-21 VITALS — BP 136/72 | HR 58 | Ht 65.0 in | Wt 207.0 lb

## 2013-12-21 DIAGNOSIS — R0609 Other forms of dyspnea: Secondary | ICD-10-CM

## 2013-12-21 DIAGNOSIS — R0989 Other specified symptoms and signs involving the circulatory and respiratory systems: Secondary | ICD-10-CM

## 2013-12-21 DIAGNOSIS — R002 Palpitations: Secondary | ICD-10-CM

## 2013-12-21 DIAGNOSIS — I119 Hypertensive heart disease without heart failure: Secondary | ICD-10-CM

## 2013-12-21 NOTE — Patient Instructions (Addendum)
Your physician recommends that you continue on your current medications as directed. Please refer to the Current Medication list given to you today.  Your physician has requested that you have an echocardiogram. Echocardiography is a painless test that uses sound waves to create images of your heart. It provides your doctor with information about the size and shape of your heart and how well your heart's chambers and valves are working. This procedure takes approximately one hour. There are no restrictions for this procedure.  Your physician has recommended that you wear an event monitor. Event monitors are medical devices that record the heart's electrical activity. Doctors most often [place] these monitors to diagnose arrhythmias. Arrhythmias are problems with the speed or rhythm of the heartbeat. The monitor is a small, portable device. You can wear one while you do your normal daily activities. This is usually used to diagnose what is causing palpitations/syncope (passing out).  FOLLOW UP AS NEEDED

## 2013-12-21 NOTE — Progress Notes (Signed)
Gilles Chiquito Date of Birth:  02/17/1946 [facility] [address] [place], [place]  [address] [phone]        Fax   [phone]   History of Present Illness: This pleasant 68 year old woman is seen at the request of Dr. Jeralene Huff of [facility].  She is seen for evaluation of possible paroxysmal atrial fibrillation.  She states that about a month ago she had an episode of rapid irregular heartbeat lasting about 3 hours.  It has not recurred.  About 2 years ago she had a similar episode which lasted about 2 hours.  She does not have any history of ischemic heart disease.  She was seen in the emergency room in March 2014 with chest pain.  She ruled out for myocardial infarction.  A stress test was recommended but she never followed up with that.  She has never had an echocardiogram.  She has a past history of high blood pressure for the past 30 years.  She does not have any history of known hypercholesterolemia or diabetes.  She has never smoked.  She works as a Engineer, maintenance (IT).  She is in the process of trying to retire.  She normally walks 1 mile each evening with her husband for exercise.  She has not had any exertional chest pain.  She has had exertional dyspnea walking up hills. Her family history reveals that her mother died of heart failure and a stroke.  Her father's history is negative for any cardiac problems .  Current Outpatient Prescriptions  Medication Sig Dispense Refill  . aspirin EC 81 MG EC tablet Take 1 tablet (81 mg total) by mouth daily.  30 tablet  11  . clobetasol cream (TEMOVATE) 2.99 % Apply 1 application topically daily as needed (vaginally).      Marland Kitchen lisinopril-hydrochlorothiazide (PRINZIDE,ZESTORETIC) 20-25 MG per tablet Take 1 tablet by mouth daily.        Marland Kitchen omeprazole (PRILOSEC OTC) 20 MG tablet Take 20 mg by mouth daily.        . Probiotic Product (PROBIOTIC FORMULA PO) Take 1 capsule by mouth daily.        No current facility-administered  medications for this visit.    Allergies  Allergen Reactions  . Codeine Nausea And Vomiting  . Morphine Nausea And Vomiting    Patient Active Problem List   Diagnosis Date Noted  . Chest pain 07/02/2012  . Hematuria 06/30/2012  . Skin abnormalities   . Obesity, unspecified 08/23/2008  . ARTHRITIS 08/23/2008  . HYPERTENSION 08/18/2008  . GERD 08/18/2008  . DIVERTICULOSIS OF COLON 08/18/2008  . DIVERTICULITIS, COLON, WITH PERFORATION 08/18/2008  . RENAL CYST, LEFT 08/18/2008    History  Smoking status  . Never Smoker   Smokeless tobacco  . Never Used    History  Alcohol Use No    Family History  Problem Relation Age of Onset  . Hypertension Mother   . Heart disease Mother   . Uterine cancer Maternal Grandmother   . Stomach cancer Maternal Grandfather   . Colon cancer Neg Hx   . Stroke Mother   . Heart disease Maternal Uncle     Review of Systems: Constitutional: no fever chills diaphoresis or fatigue or change in weight.  Head and neck: no hearing loss, no epistaxis, no photophobia or visual disturbance. Respiratory: No cough, shortness of breath or wheezing. Cardiovascular: Positive for palpitations and for exertional dyspnea Gastrointestinal: No abdominal distention, no abdominal pain, no  change in bowel habits hematochezia or melena. Genitourinary: No dysuria, no frequency, no urgency, no nocturia. Musculoskeletal:No arthralgias, no back pain, no gait disturbance or myalgias. Neurological: No dizziness, no headaches, no numbness, no seizures, no syncope, no weakness, no tremors. Hematologic: No lymphadenopathy, no easy bruising. Psychiatric: No confusion, no hallucinations, no sleep disturbance.    Physical Exam: Filed Vitals:   12/21/13 0854  BP: 136/72  Pulse: 58  The patient appears to be in no distress.  Head and neck exam reveals that the pupils are equal and reactive.  The extraocular movements are full.  There is no scleral icterus.  Mouth and  pharynx are benign.  No lymphadenopathy.  No carotid bruits.  The jugular venous pressure is normal.  Thyroid is not enlarged or tender.  Chest is clear to percussion and auscultation.  No rales or rhonchi.  Expansion of the chest is symmetrical.  Heart reveals no abnormal lift or heave.  First and second heart sounds are normal.  There is no murmur gallop rub or click.  The abdomen is soft and nontender.  Bowel sounds are normoactive.  There is no hepatosplenomegaly or mass.  There are no abdominal bruits.  Extremities reveal no phlebitis or edema.  Pedal pulses are good.  There is no cyanosis or clubbing.  Neurologic exam is normal strength and no lateralizing weakness.  No sensory deficits.  Integument reveals no rash  EKG today shows normal sinus rhythm and is within normal limits.  Since last tracing of 07/03/12, no significant change.  Assessment / Plan: 1. history of possible paroxysmal atrial fibrillation.  Her arrhythmia has never been documented.  She has had 2 episodes 2 years apart, most recently one month ago 2. essential hypertension 3. exertional dyspnea  Plan: She will continue taking a baby aspirin daily.  We will have her wear an event monitor for 4 weeks to look for occult arrhythmia.  She will return for a two-dimensional echocardiogram.  Many thanks for the opportunity to see this pleasant woman with you.  We will be in touch with you regarding the results of her studies.  She had a chest x-ray a year ago which showed normal heart and clear lungs.

## 2013-12-22 ENCOUNTER — Ambulatory Visit (HOSPITAL_COMMUNITY): Payer: Medicare Other | Attending: Cardiology

## 2013-12-22 ENCOUNTER — Encounter (INDEPENDENT_AMBULATORY_CARE_PROVIDER_SITE_OTHER): Payer: Medicare Other

## 2013-12-22 ENCOUNTER — Encounter: Payer: Self-pay | Admitting: *Deleted

## 2013-12-22 DIAGNOSIS — R002 Palpitations: Secondary | ICD-10-CM

## 2013-12-22 DIAGNOSIS — I77819 Aortic ectasia, unspecified site: Secondary | ICD-10-CM

## 2013-12-22 DIAGNOSIS — I059 Rheumatic mitral valve disease, unspecified: Secondary | ICD-10-CM

## 2013-12-22 DIAGNOSIS — E669 Obesity, unspecified: Secondary | ICD-10-CM | POA: Diagnosis not present

## 2013-12-22 DIAGNOSIS — R0609 Other forms of dyspnea: Secondary | ICD-10-CM | POA: Diagnosis present

## 2013-12-22 DIAGNOSIS — I519 Heart disease, unspecified: Secondary | ICD-10-CM | POA: Insufficient documentation

## 2013-12-22 DIAGNOSIS — I10 Essential (primary) hypertension: Secondary | ICD-10-CM | POA: Diagnosis not present

## 2013-12-22 DIAGNOSIS — K219 Gastro-esophageal reflux disease without esophagitis: Secondary | ICD-10-CM | POA: Insufficient documentation

## 2013-12-22 DIAGNOSIS — R319 Hematuria, unspecified: Secondary | ICD-10-CM | POA: Insufficient documentation

## 2013-12-22 DIAGNOSIS — Z6834 Body mass index (BMI) 34.0-34.9, adult: Secondary | ICD-10-CM | POA: Insufficient documentation

## 2013-12-22 DIAGNOSIS — R0989 Other specified symptoms and signs involving the circulatory and respiratory systems: Secondary | ICD-10-CM | POA: Insufficient documentation

## 2013-12-22 DIAGNOSIS — K5730 Diverticulosis of large intestine without perforation or abscess without bleeding: Secondary | ICD-10-CM | POA: Diagnosis not present

## 2013-12-22 DIAGNOSIS — I079 Rheumatic tricuspid valve disease, unspecified: Secondary | ICD-10-CM

## 2013-12-22 DIAGNOSIS — M129 Arthropathy, unspecified: Secondary | ICD-10-CM | POA: Insufficient documentation

## 2013-12-22 DIAGNOSIS — Q619 Cystic kidney disease, unspecified: Secondary | ICD-10-CM | POA: Insufficient documentation

## 2013-12-22 DIAGNOSIS — I2789 Other specified pulmonary heart diseases: Secondary | ICD-10-CM

## 2013-12-22 NOTE — Progress Notes (Signed)
Patient ID: Michelle Coleman, female   DOB: 15-Mar-1946, 68 y.o.   MRN: [id number] [employer] [facility] 30 day cardiac event monitor applied to patient.

## 2013-12-22 NOTE — Progress Notes (Signed)
Echo completed

## 2014-01-02 ENCOUNTER — Telehealth: Payer: Self-pay | Admitting: *Deleted

## 2014-01-02 NOTE — Telephone Encounter (Signed)
Advised patient and will send to Dr Jeralene Huff

## 2014-01-02 NOTE — Telephone Encounter (Signed)
Message copied by Earvin Hansen on Mon Jan 02, 2014  6:10 PM ------      Message from: Darlin Coco      Created: Wed Dec 28, 2013  1:00 PM       Please report.  Echo is satisfactory.  Good systolic function.  Mild diastolic dysfunction.  No significant valve abnormalities.Send copy to Dr. Jeralene Huff.      Awaiting results of event monitor. ------

## 2014-01-31 ENCOUNTER — Telehealth: Payer: Self-pay | Admitting: *Deleted

## 2014-01-31 NOTE — Telephone Encounter (Signed)
Advised patient of results  30 day event monitor reviewed by  Dr. Mare Ferrari   Interpretation:  Normal 4 week event monitor

## 2014-02-20 ENCOUNTER — Encounter: Payer: Self-pay | Admitting: Cardiology

## 2014-09-11 ENCOUNTER — Encounter: Payer: Self-pay | Admitting: Internal Medicine

## 2014-11-29 ENCOUNTER — Ambulatory Visit (INDEPENDENT_AMBULATORY_CARE_PROVIDER_SITE_OTHER)
Admission: RE | Admit: 2014-11-29 | Discharge: 2014-11-29 | Disposition: A | Discharge status: Home / Self Care | Payer: Medicare Other | Source: Ambulatory Visit | Attending: Internal Medicine | Admitting: Internal Medicine

## 2014-11-29 ENCOUNTER — Other Ambulatory Visit (INDEPENDENT_AMBULATORY_CARE_PROVIDER_SITE_OTHER): Payer: Medicare Other

## 2014-11-29 ENCOUNTER — Ambulatory Visit (INDEPENDENT_AMBULATORY_CARE_PROVIDER_SITE_OTHER): Payer: Medicare Other | Admitting: Internal Medicine

## 2014-11-29 ENCOUNTER — Encounter: Payer: Self-pay | Admitting: Internal Medicine

## 2014-11-29 VITALS — BP 124/76 | HR 76 | Ht 65.35 in | Wt 213.2 lb

## 2014-11-29 DIAGNOSIS — K5732 Diverticulitis of large intestine without perforation or abscess without bleeding: Secondary | ICD-10-CM

## 2014-11-29 DIAGNOSIS — K529 Noninfective gastroenteritis and colitis, unspecified: Secondary | ICD-10-CM

## 2014-11-29 LAB — CBC WITH DIFFERENTIAL/PLATELET
BASOS ABS: 0 10*3/uL (ref 0.0–0.1)
Basophils Relative: 0.5 % (ref 0.0–3.0)
EOS ABS: 0.2 10*3/uL (ref 0.0–0.7)
Eosinophils Relative: 1.9 % (ref 0.0–5.0)
HEMATOCRIT: 39 % (ref 36.0–46.0)
HEMOGLOBIN: 13.4 g/dL (ref 12.0–15.0)
LYMPHS PCT: 33.5 % (ref 12.0–46.0)
Lymphs Abs: 2.7 10*3/uL (ref 0.7–4.0)
MCHC: 34.2 g/dL (ref 30.0–36.0)
MCV: 83.8 fl (ref 78.0–100.0)
MONOS PCT: 7.7 % (ref 3.0–12.0)
Monocytes Absolute: 0.6 10*3/uL (ref 0.1–1.0)
NEUTROS ABS: 4.6 10*3/uL (ref 1.4–7.7)
NEUTROS PCT: 56.4 % (ref 43.0–77.0)
PLATELETS: 230 10*3/uL (ref 150.0–400.0)
RBC: 4.66 Mil/uL (ref 3.87–5.11)
RDW: 14.9 % (ref 11.5–15.5)
WBC: 8.1 10*3/uL (ref 4.0–10.5)

## 2014-11-29 LAB — BASIC METABOLIC PANEL
BUN: 24 mg/dL — AB (ref 6–23)
CO2: 32 mEq/L (ref 19–32)
CREATININE: 0.94 mg/dL (ref 0.40–1.20)
Calcium: 9.8 mg/dL (ref 8.4–10.5)
Chloride: 103 mEq/L (ref 96–112)
GFR: 62.79 mL/min (ref >60.00)
GLUCOSE: 97 mg/dL (ref 70–99)
Potassium: 3.4 mEq/L — ABNORMAL LOW (ref 3.5–5.1)
Sodium: 141 mEq/L (ref 135–145)

## 2014-11-29 LAB — SEDIMENTATION RATE: SED RATE: 41 mm/h — AB (ref 0–22)

## 2014-11-29 MED ORDER — IOHEXOL 300 MG/ML  SOLN
100.0000 mL | Freq: Once | INTRAMUSCULAR | Status: AC | PRN
  Administered 2014-11-29: 100 mL via INTRAVENOUS

## 2014-11-29 MED ORDER — AMOXICILLIN-POT CLAVULANATE 875-125 MG PO TABS: 1.0000 | ORAL_TABLET | Freq: Two times a day (BID) | ORAL | Status: DC

## 2014-11-29 NOTE — Patient Instructions (Addendum)
Your physician has requested that you go to the basement for the following lab work before leaving today: BMET, CBC, Sed Rate   Please use a low residue diet , handout provided.  We have sent the following medications to your pharmacy for you to pick up at your convenience: Augmentin  Please take a probiotic daily while your on the augmentin.   You have been scheduled for a CT scan of the abdomen and pelvis at [facility] ([address]---this is in the same building as Press [facility]).   You are scheduled on 11/29/14 at 3:45pm. You should arrive 15 minutes prior to your appointment time for registration. Please follow the written instructions below on the day of your exam:  WARNING: IF YOU ARE ALLERGIC TO IODINE/X-RAY DYE, PLEASE NOTIFY RADIOLOGY IMMEDIATELY AT [phone]! YOU WILL BE GIVEN A 13 HOUR PREMEDICATION PREP.  1) Do not eat or drink anything after 11:45AM (4 hours prior to your test) 2) You have been given 2 bottles of oral contrast to drink. The solution may taste  better if refrigerated, but do NOT add ice or any other liquid to this solution. Shake  well before drinking.    Drink 1 bottle of contrast @ 1:45PM (2 hours prior to your exam)  Drink 1 bottle of contrast @ 2:45PM (1 hour prior to your exam)  You may take any medications as prescribed with a small amount of water except for the following: Metformin, Glucophage, Glucovance, Avandamet, Riomet, Fortamet, Actoplus Met, Janumet, Glumetza or Metaglip. The above medications must be held the day of the exam AND 48 hours after the exam.  The purpose of you drinking the oral contrast is to aid in the visualization of your intestinal tract. The contrast solution may cause some diarrhea. Before your exam is started, you will be given a small amount of fluid to drink. Depending on your individual set of symptoms, you may also receive an intravenous injection of x-ray contrast/dye. Plan on being at [facility] for 30 minutes or long, depending on the type of exam you are having performed.  This test typically takes 30-45 minutes to complete.  If you have any questions regarding your exam or if you need to reschedule, you may call the CT department at [phone] between the hours of 8:00 am and 5:00 pm, Monday-Friday.  ________________________________________________________________________   I appreciate the opportunity to care for you.

## 2014-11-29 NOTE — Progress Notes (Signed)
Michelle Coleman [birth year] [id number]  Note: This dictation was prepared with Dragon digital system. Any transcriptional errors that result from this procedure are unintentional.   History of Present Illness: This is a 69 year old white female known to me from screening  colonoscopy in June 2010. She had an episode of diverticulitis in 2010 demonstrated on CT scan of the abdomen which revealed phlegmon in the left colon. She is here today with recurrent lower abdominal pain which started rather suddenly about 6 days ago shortly after breakfast. She describes severe crampy lower abdominal pain followed by multiple loose stools and eventually passage of  mucus and blood. She stayed on a liquid diet for couple of days and  gradually improved to be able to eat solid food. 4 days later she had another attack ,again after eating breakfast, with severe lower abdominal pain and severe diarrhea with some blood. There was no fever nausea or vomiting. She checks her blood pressure every day. She has been on antihypertensives medication HCTZ and lisinopril. She feels that she may stay dehydrated cause of the diuretic. She denies taking Estrogen or narcotics.. She had prior cholecystectomy.    Past Medical History  Diagnosis Date  . Hypertension   . GERD (gastroesophageal reflux disease)   . Diverticulosis   . Renal cyst     left  . Hx of cholecystectomy   . Skin abnormalities 38/4536    lichens sclerosis, kerotosis ( 11/2006)  . 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 Cholecystectomy  1992  . Rotator cuff repair  Jan 2012  . Bunionectomy  1984  . Tonsillectomy and adenoidectomy  1950  . Facial cyst removal  1983    Allergies  Allergen Reactions  . Codeine Nausea And Vomiting  . Morphine Nausea And Vomiting    Family history and social history have been reviewed.  Review of Systems: Lower abdominal pain. Diarrhea now improved. Denies fever  weight loss  The remainder of the 10 point ROS is negative except as outlined in the H&P  Physical Exam: General Appearance Well developed, in no distress Eyes  Non icteric  HEENT  Non traumatic, normocephalic  Mouth No lesion, tongue papillated, no cheilosis Neck Supple without adenopathy, thyroid not enlarged, no carotid bruits, no JVD Lungs Clear to auscultation bilaterally COR Normal S1, normal S2, regular rhythm, no murmur, quiet precordium Abdomen soft abdomen with quiet bowel sounds. Very tender in left upper quadrant at the splenic flexure. Left lower quadrant is normal. Right side of the abdomen is unremarkable. 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 We will proceed with CT scan of the abdomen and pelvis with oral and IV contrast and we will start  Augmentin 875 mg twice a day for one week. She will continue a low residue diet and take probiotic daily. She is up-to-date on colonoscopy. Last exam in June 2010. She has a recall planned for June 2020 unless her symptoms continue and we will have to directly visualize the left colon. We will be checking her CBC, sedimentation rate  and metabolic panel    Delfin Edis 11/29/2014

## 2015-03-12 ENCOUNTER — Other Ambulatory Visit: Payer: Self-pay | Admitting: Radiology

## 2015-05-22 ENCOUNTER — Ambulatory Visit (INDEPENDENT_AMBULATORY_CARE_PROVIDER_SITE_OTHER): Payer: Medicare Other | Admitting: Women's Health

## 2015-05-22 ENCOUNTER — Encounter: Payer: Self-pay | Admitting: Women's Health

## 2015-05-22 VITALS — BP 126/80 | Ht 65.0 in | Wt 219.0 lb

## 2015-05-22 DIAGNOSIS — L900 Lichen sclerosus et atrophicus: Secondary | ICD-10-CM

## 2015-05-22 DIAGNOSIS — N898 Other specified noninflammatory disorders of vagina: Secondary | ICD-10-CM

## 2015-05-22 DIAGNOSIS — L298 Other pruritus: Secondary | ICD-10-CM | POA: Diagnosis not present

## 2015-05-22 LAB — WET PREP FOR TRICH, YEAST, CLUE
CLUE CELLS WET PREP: NONE SEEN
TRICH WET PREP: NONE SEEN
YEAST WET PREP: NONE SEEN

## 2015-05-22 MED ORDER — CLOBETASOL PROPIONATE 0.05 % EX CREA: 1.0000 "application " | TOPICAL_CREAM | Freq: Every day | CUTANEOUS | Status: DC | PRN

## 2015-05-22 NOTE — Patient Instructions (Signed)
Lichen Sclerosus Lichen sclerosus is a skin problem. It can happen on any part of the body, but it commonly involves the anal or genital areas. It can cause itching and discomfort in these areas. Treatment can help to control symptoms. When the genital area is affected, getting treatment is important because the condition can cause scarring that may lead to other problems. CAUSES The cause of this condition is not known. It could be the result of an overactive immune system or a lack of certain hormones. Lichen sclerosus is not an infection or a fungus. It is not passed from one person to another (not contagious). RISK FACTORS This condition is more likely to develop in women, usually after menopause. SYMPTOMS Symptoms of this condition include:  Thin, wrinkled, white areas on the skin.  Thickened white areas on the skin.  Red and swollen patches (lesions) on the skin.  Tears or cracks in the skin.  Bruising.  Blood blisters.  Severe itching. You may also have pain, itching, or burning with urination. Constipation is also common in people with lichen sclerosus. DIAGNOSIS This condition may be diagnosed with a physical exam. In some cases, a tissue sample (biopsy sample) may be removed to be looked at under a microscope. TREATMENT This condition is usually treated with medicated creams or ointments (topical steroids) that are applied over the affected areas. HOME CARE INSTRUCTIONS  Take over-the-counter and prescription medicines only as told by your health care provider.  Use creams or ointments as told by your health care provider.  Do not scratch the affected areas of skin.  Women should keep the vaginal area as clean and dry as possible.  Keep all follow-up visits as told by your health care provider. This is important. SEEK MEDICAL CARE IF:  You have increasing redness, swelling, or pain in the affected area.  You have fluid, blood, or pus coming from the affected  area.  You have new lesions on your skin.  You have pain or burning with urination.  You have pain during sex.   This information is not intended to replace advice given to you by your health care provider. Make sure you discuss any questions you have with your health care provider.   Document Released: 08/28/2010 Document Revised: 12/27/2014 Document Reviewed: 07/03/2014 Elsevier Interactive Patient Education [employer] Menopause is a normal process in which your reproductive ability comes to an end. This process happens gradually over a span of months to years, usually between the ages of 43 and 19. Menopause is complete when you have missed 12 consecutive menstrual periods. It is important to talk with your health care provider about some of the most common conditions that affect postmenopausal women, such as heart disease, cancer, and bone loss (osteoporosis). Adopting a healthy lifestyle and getting preventive care can help to promote your health and wellness. Those actions can also lower your chances of developing some of these common conditions. WHAT SHOULD I KNOW ABOUT MENOPAUSE? During menopause, you may experience a number of symptoms, such as:  Moderate-to-severe hot flashes.  Night sweats.  Decrease in sex drive.  Mood swings.  Headaches.  Tiredness.  Irritability.  Memory problems.  Insomnia. Choosing to treat or not to treat menopausal changes is an individual decision that you make with your health care provider. WHAT SHOULD I KNOW ABOUT HORMONE REPLACEMENT THERAPY AND SUPPLEMENTS? Hormone therapy products are effective for treating symptoms that are associated with menopause, such as hot flashes and night sweats. Hormone replacement  carries certain risks, especially as you become older. If you are thinking about using estrogen or estrogen with progestin treatments, discuss the benefits and risks with your health care provider. WHAT SHOULD I KNOW ABOUT  HEART DISEASE AND STROKE? Heart disease, heart attack, and stroke become more likely as you age. This may be due, in part, to the hormonal changes that your body experiences during menopause. These can affect how your body processes dietary fats, triglycerides, and cholesterol. Heart attack and stroke are both medical emergencies. There are many things that you can do to help prevent heart disease and stroke:  Have your blood pressure checked at least every 1-2 years. High blood pressure causes heart disease and increases the risk of stroke.  If you are 84-59 years old, ask your health care provider if you should take aspirin to prevent a heart attack or a stroke.  Do not use any tobacco products, including cigarettes, chewing tobacco, or electronic cigarettes. If you need help quitting, ask your health care provider.  It is important to eat a healthy diet and maintain a healthy weight.  Be sure to include plenty of vegetables, fruits, low-fat dairy products, and lean protein.  Avoid eating foods that are high in solid fats, added sugars, or salt (sodium).  Get regular exercise. This is one of the most important things that you can do for your health.  Try to exercise for at least 150 minutes each week. The type of exercise that you do should increase your heart rate and make you sweat. This is known as moderate-intensity exercise.  Try to do strengthening exercises at least twice each week. Do these in addition to the moderate-intensity exercise.  Know your numbers.Ask your health care provider to check your cholesterol and your blood glucose. Continue to have your blood tested as directed by your health care provider. WHAT SHOULD I KNOW ABOUT CANCER SCREENING? There are several types of cancer. Take the following steps to reduce your risk and to catch any cancer development as early as possible. Breast Cancer  Practice breast self-awareness.  This means understanding how your breasts  normally appear and feel.  It also means doing regular breast self-exams. Let your health care provider know about any changes, no matter how small.  If you are 12 or older, have a clinician do a breast exam (clinical breast exam or CBE) every year. Depending on your age, family history, and medical history, it may be recommended that you also have a yearly breast X-ray (mammogram).  If you have a family history of breast cancer, talk with your health care provider about genetic screening.  If you are at high risk for breast cancer, talk with your health care provider about having an MRI and a mammogram every year.  Breast cancer (BRCA) gene test is recommended for women who have family members with BRCA-related cancers. Results of the assessment will determine the need for genetic counseling and BRCA1 and for BRCA2 testing. BRCA-related cancers include these types:  Breast. This occurs in males or females.  Ovarian.  Tubal. This may also be called fallopian tube cancer.  Cancer of the abdominal or pelvic lining (peritoneal cancer).  Prostate.  Pancreatic. Cervical, Uterine, and Ovarian Cancer Your health care provider may recommend that you be screened regularly for cancer of the pelvic organs. These include your ovaries, uterus, and vagina. This screening involves a pelvic exam, which includes checking for microscopic changes to the surface of your cervix (Pap test).  For  women ages 21-65, health care providers may recommend a pelvic exam and a Pap test every three years. For women ages 31-65, they may recommend the Pap test and pelvic exam, combined with testing for human papilloma virus (HPV), every five years. Some types of HPV increase your risk of cervical cancer. Testing for HPV may also be done on women of any age who have unclear Pap test results.  Other health care providers may not recommend any screening for nonpregnant women who are considered low risk for pelvic cancer and  have no symptoms. Ask your health care provider if a screening pelvic exam is right for you.  If you have had past treatment for cervical cancer or a condition that could lead to cancer, you need Pap tests and screening for cancer for at least 20 years after your treatment. If Pap tests have been discontinued for you, your risk factors (such as having a new sexual partner) need to be reassessed to determine if you should start having screenings again. Some women have medical problems that increase the chance of getting cervical cancer. In these cases, your health care provider may recommend that you have screening and Pap tests more often.  If you have a family history of uterine cancer or ovarian cancer, talk with your health care provider about genetic screening.  If you have vaginal bleeding after reaching menopause, tell your health care provider.  There are currently no reliable tests available to screen for ovarian cancer. Lung Cancer Lung cancer screening is recommended for adults 53-44 years old who are at high risk for lung cancer because of a history of smoking. A yearly low-dose CT scan of the lungs is recommended if you:  Currently smoke.  Have a history of at least 30 pack-years of smoking and you currently smoke or have quit within the past 15 years. A pack-year is smoking an average of one pack of cigarettes per day for one year. Yearly screening should:  Continue until it has been 15 years since you quit.  Stop if you develop a health problem that would prevent you from having lung cancer treatment. Colorectal Cancer  This type of cancer can be detected and can often be prevented.  Routine colorectal cancer screening usually begins at age 85 and continues through age 23.  If you have risk factors for colon cancer, your health care provider may recommend that you be screened at an earlier age.  If you have a family history of colorectal cancer, talk with your health care  provider about genetic screening.  Your health care provider may also recommend using home test kits to check for hidden blood in your stool.  A small camera at the end of a tube can be used to examine your colon directly (sigmoidoscopy or colonoscopy). This is done to check for the earliest forms of colorectal cancer.  Direct examination of the colon should be repeated every 5-10 years until age 54. However, if early forms of precancerous polyps or small growths are found or if you have a family history or genetic risk for colorectal cancer, you may need to be screened more often. Skin Cancer  Check your skin from head to toe regularly.  Monitor any moles. Be sure to tell your health care provider:  About any new moles or changes in moles, especially if there is a change in a mole's shape or color.  If you have a mole that is larger than the size of a pencil eraser.  If any of your family members has a history of skin cancer, especially at a young age, talk with your health care provider about genetic screening.  Always use sunscreen. Apply sunscreen liberally and repeatedly throughout the day.  Whenever you are outside, protect yourself by wearing long sleeves, pants, a wide-brimmed hat, and sunglasses. WHAT SHOULD I KNOW ABOUT OSTEOPOROSIS? Osteoporosis is a condition in which bone destruction happens more quickly than new bone creation. After menopause, you may be at an increased risk for osteoporosis. To help prevent osteoporosis or the bone fractures that can happen because of osteoporosis, the following is recommended:  If you are 50-86 years old, get at least 1,000 mg of calcium and at least 600 mg of vitamin D per day.  If you are older than age 30 but younger than age 61, get at least 1,200 mg of calcium and at least 600 mg of vitamin D per day.  If you are older than age 40, get at least 1,200 mg of calcium and at least 800 mg of vitamin D per day. Smoking and excessive  alcohol intake increase the risk of osteoporosis. Eat foods that are rich in calcium and vitamin D, and do weight-bearing exercises several times each week as directed by your health care provider. WHAT SHOULD I KNOW ABOUT HOW MENOPAUSE AFFECTS Michelle Coleman? Depression may occur at any age, but it is more common as you become older. Common symptoms of depression include:  Low or sad mood.  Changes in sleep patterns.  Changes in appetite or eating patterns.  Feeling an overall lack of motivation or enjoyment of activities that you previously enjoyed.  Frequent crying spells. Talk with your health care provider if you think that you are experiencing depression. WHAT SHOULD I KNOW ABOUT IMMUNIZATIONS? It is important that you get and maintain your immunizations. These include:  Tetanus, diphtheria, and pertussis (Tdap) booster vaccine.  Influenza every year before the flu season begins.  Pneumonia vaccine.  Shingles vaccine. Your health care provider may also recommend other immunizations.   This information is not intended to replace advice given to you by your health care provider. Make sure you discuss any questions you have with your health care provider.   Document Released: 05/30/2005 Document Revised: 04/28/2014 Document Reviewed: 12/08/2013 Elsevier Interactive Patient Education [employer].

## 2015-05-22 NOTE — Progress Notes (Signed)
Michelle Coleman 11/20/1945 [id number]    History:    Presents for breast and pelvic exam. Postmenopausal on no HRT with no bleeding. History of colitis last colonoscopy 2010. Normal Pap history. 02/2015 benign breast biopsy. Hypertension managed by primary care. Current on vaccines. Lichen sclerosis confirmed by biopsy 2006 has some relief with Temovate.   Past medical history, past surgical history, family history and social history were all reviewed and documented in the EPIC chart. Retired Engineer, maintenance ([employer]). Lives part of the year in [place]. Husband bladder cancer doing well.  ROS:  A ROS was performed and pertinent positives and negatives are included.  Exam:  Filed Vitals:   05/22/15 0927  BP: 126/80    General appearance:  Normal Thyroid:  Symmetrical, normal in size, without palpable masses or nodularity. Respiratory  Auscultation:  Clear without wheezing or rhonchi Cardiovascular  Auscultation:  Regular rate, without rubs, murmurs or gallops  Edema/varicosities:  Not grossly evident Abdominal  Soft,nontender, without masses, guarding or rebound.  Liver/spleen:  No organomegaly noted  Hernia:  None appreciated  Skin  Inspection:  Grossly normal   Breasts: Examined lying and sitting.     Right: Without masses, retractions, discharge or axillary adenopathy.     Left: Without masses, retractions, discharge or axillary adenopathy. Gentitourinary   Inguinal/mons:  Normal without inguinal adenopathy  External genitalia:  Lichen sclerosis at clitoral hood and at anterior introitus  BUS/Urethra/Skene's glands:  Normal  Vagina:  Normal  Cervix:  Normal  Uterus:  normal in size, shape and contour.  Midline and mobile  Adnexa/parametria:     Rt: Without masses or tenderness.   Lt: Without masses or tenderness.  Anus and perineum: Normal  Digital rectal exam: Normal sphincter tone without palpated masses or tenderness  Assessment/Plan:  70 y.o. M WF G2 P2 for breast and pelvic  exam  Postmenopausal on no HRT with no bleeding Lichen sclerosis Hypertension-primary care manages labs and meds 02/2015 benign breast biopsy  Plan: Prescription for Temovate, use small amount when necessary prescription, proper use given and reviewed. SBE's, continue annual screening mammogram, calcium rich diet, vitamin D 1000 daily encouraged. Home safety, fall prevention and importance of weightbearing exercise reviewed. Reviewed importance of decreasing carbohydrates for weight loss encouraged.    Marland KitchenBirnamwood, 9:59 AM 05/22/2015

## 2016-09-23 ENCOUNTER — Other Ambulatory Visit: Payer: Self-pay | Admitting: Women's Health

## 2016-09-23 DIAGNOSIS — L900 Lichen sclerosus et atrophicus: Secondary | ICD-10-CM

## 2016-12-25 ENCOUNTER — Telehealth: Payer: Self-pay | Admitting: *Deleted

## 2016-12-25 NOTE — Telephone Encounter (Signed)
(  You are back up md) patient called c/o vaginal irritation with clear discharge and vaginal itch with slight vaginal odor. Pt has Lichen sclerosus out of clobetasol cream, overdue for annual will schedule. Asked if Rx could be prescribed today. Please advise

## 2016-12-25 NOTE — Telephone Encounter (Signed)
Recommend office visit to evaluate

## 2016-12-25 NOTE — Telephone Encounter (Signed)
Pt aware, will have front desk to schedule.

## 2016-12-26 ENCOUNTER — Encounter: Payer: Self-pay | Admitting: Obstetrics & Gynecology

## 2016-12-26 ENCOUNTER — Ambulatory Visit (INDEPENDENT_AMBULATORY_CARE_PROVIDER_SITE_OTHER): Payer: Medicare Other | Admitting: Obstetrics & Gynecology

## 2016-12-26 VITALS — BP 136/80

## 2016-12-26 DIAGNOSIS — N904 Leukoplakia of vulva: Secondary | ICD-10-CM

## 2016-12-26 DIAGNOSIS — N898 Other specified noninflammatory disorders of vagina: Secondary | ICD-10-CM | POA: Diagnosis not present

## 2016-12-26 LAB — WET PREP FOR TRICH, YEAST, CLUE

## 2016-12-26 MED ORDER — CLOBETASOL PROPIONATE 0.05 % EX OINT: 1.0000 "application " | TOPICAL_OINTMENT | Freq: Every day | CUTANEOUS | 4 refills | Status: AC

## 2016-12-26 NOTE — Progress Notes (Signed)
    Michelle Coleman 14-Sep-1945 [id number]        71 y.o.  G2P2  RP:  Severe vulvar itching and discomfort with increased vaginal d/c  HPI:  Ran out of Clobetasol ointment.  Since stopped applying the CS ointment, worsening vulvar discomfort with irritation and itching.  Also feels that her vaginal secretions are increased, sensation of wetness at night.  No pelvic pain.  No UTI Sx.     Past medical history,surgical history, problem list, medications, allergies, family history and social history were all reviewed and documented in the EPIC chart.  Directed ROS with pertinent positives and negatives documented in the history of present illness/assessment and plan.  Exam:  Vitals:   12/26/16 0939  BP: 136/80   General appearance:  Normal  Gyn exam:  Vulva:  Severe atrophy of labia minora which are absent with thin white skin throughout vulva.                    Speculum:  Increased vaginal d/c, wet prep done.  Cervix/vagina normal.  Assessment/Plan:  71 y.o. G2P2   1. 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  Needs to restart on CS ointment.  Clobetasol ointment 0.05% represcribed.  Usage reviewed.  Will apply at bedtime x 14 days, then long term twice a week.  F/U reevaluation in 4 wks with Annual exam/Gyn exam with Elon Alas.  2. Vaginal discharge Wet prep negative, reassured.  Probiotic tab vaginally every week as needed for prevention. - WET PREP FOR TRICH, YEAST, CLUE  Counseling on above issues >50% x 15 minutes  Princess Bruins MD, 9:48 AM 12/26/2016

## 2016-12-28 NOTE — Patient Instructions (Signed)
1. 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  Needs to restart on CS ointment.  Clobetasol ointment 0.05% represcribed.  Usage reviewed.  Will apply at bedtime x 14 days, then long term twice a week.  F/U reevaluation in 4 wks with Annual exam/Gyn exam with Elon Alas.  2. Vaginal discharge Wet prep negative, reassured.  Probiotic tab vaginally every week as needed for prevention. - WET PREP FOR [place], YEAST, CLUE  Anaiz, it was a pleasure to meet you today!     Lichen Sclerosus Lichen sclerosus is a skin problem. It can happen on any part of the body, but it commonly involves the anal or genital areas. It can cause itching and discomfort in these areas. Treatment can help to control symptoms. When the genital area is affected, getting treatment is important because the condition can cause scarring that may lead to other problems. What are the causes? The cause of this condition is not known. It could be the result of an overactive immune system or a lack of certain hormones. Lichen sclerosus is not an infection or a fungus. It is not passed from one person to another (not contagious). What increases the risk? This condition is more likely to develop in women, usually after menopause. What are the signs or symptoms? Symptoms of this condition include:  Thin, wrinkled, white areas on the skin.  Thickened white areas on the skin.  Red and swollen patches (lesions) on the skin.  Tears or cracks in the skin.  Bruising.  Blood blisters.  Severe itching.  You may also have pain, itching, or burning with urination. Constipation is also common in people with lichen sclerosus. How is this diagnosed? This condition may be diagnosed with a physical exam. In some cases, a tissue sample (biopsy sample) may be removed to be looked at under a microscope. How is this treated? This condition is usually treated with medicated creams or ointments (topical steroids) that  are applied over the affected areas. Follow these instructions at home:  Take over-the-counter and prescription medicines only as told by your health care provider.  Use creams or ointments as told by your health care provider.  Do not scratch the affected areas of skin.  Women should keep the vaginal area as clean and dry as possible.  Keep all follow-up visits as told by your health care provider. This is important. Contact a health care provider if:  You have increasing redness, swelling, or pain in the affected area.  You have fluid, blood, or pus coming from the affected area.  You have new lesions on your skin.  You have a fever.  You have pain during sex. This information is not intended to replace advice given to you by your health care provider. Make sure you discuss any questions you have with your health care provider. Document Released: 08/28/2010 Document Revised: 09/13/2015 Document Reviewed: 07/03/2014 Elsevier Interactive Patient Education  [employer].

## 2017-01-28 ENCOUNTER — Encounter: Payer: Self-pay | Admitting: Women's Health

## 2017-01-28 ENCOUNTER — Ambulatory Visit (INDEPENDENT_AMBULATORY_CARE_PROVIDER_SITE_OTHER): Payer: Medicare Other | Admitting: Women's Health

## 2017-01-28 VITALS — BP 128/80 | Ht 65.0 in | Wt 226.0 lb

## 2017-01-28 DIAGNOSIS — Z01419 Encounter for gynecological examination (general) (routine) without abnormal findings: Secondary | ICD-10-CM

## 2017-01-28 DIAGNOSIS — Z01411 Encounter for gynecological examination (general) (routine) with abnormal findings: Secondary | ICD-10-CM

## 2017-01-28 DIAGNOSIS — L900 Lichen sclerosus et atrophicus: Secondary | ICD-10-CM

## 2017-01-28 MED ORDER — CLOBETASOL PROPIONATE 0.05 % EX OINT: 1.0000 "application " | TOPICAL_OINTMENT | Freq: Two times a day (BID) | CUTANEOUS | 12 refills | Status: AC

## 2017-01-28 NOTE — Patient Instructions (Signed)
Health Maintenance for Postmenopausal Women Menopause is a normal process in which your reproductive ability comes to an end. This process happens gradually over a span of months to years, usually between the ages of 22 and 9. Menopause is complete when you have missed 12 consecutive menstrual periods. It is important to talk with your health care provider about some of the most common conditions that affect postmenopausal women, such as heart disease, cancer, and bone loss (osteoporosis). Adopting a healthy lifestyle and getting preventive care can help to promote your health and wellness. Those actions can also lower your chances of developing some of these common conditions. What should I know about menopause? During menopause, you may experience a number of symptoms, such as:  Moderate-to-severe hot flashes.  Night sweats.  Decrease in sex drive.  Mood swings.  Headaches.  Tiredness.  Irritability.  Memory problems.  Insomnia.  Choosing to treat or not to treat menopausal changes is an individual decision that you make with your health care provider. What should I know about hormone replacement therapy and supplements? Hormone therapy products are effective for treating symptoms that are associated with menopause, such as hot flashes and night sweats. Hormone replacement carries certain risks, especially as you become older. If you are thinking about using estrogen or estrogen with progestin treatments, discuss the benefits and risks with your health care provider. What should I know about heart disease and stroke? Heart disease, heart attack, and stroke become more likely as you age. This may be due, in part, to the hormonal changes that your body experiences during menopause. These can affect how your body processes dietary fats, triglycerides, and cholesterol. Heart attack and stroke are both medical emergencies. There are many things that you can do to help prevent heart disease  and stroke:  Have your blood pressure checked at least every 1-2 years. High blood pressure causes heart disease and increases the risk of stroke.  If you are 53-22 years old, ask your health care provider if you should take aspirin to prevent a heart attack or a stroke.  Do not use any tobacco products, including cigarettes, chewing tobacco, or electronic cigarettes. If you need help quitting, ask your health care provider.  It is important to eat a healthy diet and maintain a healthy weight. ? Be sure to include plenty of vegetables, fruits, low-fat dairy products, and lean protein. ? Avoid eating foods that are high in solid fats, added sugars, or salt (sodium).  Get regular exercise. This is one of the most important things that you can do for your health. ? Try to exercise for at least 150 minutes each week. The type of exercise that you do should increase your heart rate and make you sweat. This is known as moderate-intensity exercise. ? Try to do strengthening exercises at least twice each week. Do these in addition to the moderate-intensity exercise.  Know your numbers.Ask your health care provider to check your cholesterol and your blood glucose. Continue to have your blood tested as directed by your health care provider.  What should I know about cancer screening? There are several types of cancer. Take the following steps to reduce your risk and to catch any cancer development as early as possible. Breast Cancer  Practice breast self-awareness. ? This means understanding how your breasts normally appear and feel. ? It also means doing regular breast self-exams. Let your health care provider know about any changes, no matter how small.  If you are 40  or older, have a clinician do a breast exam (clinical breast exam or CBE) every year. Depending on your age, family history, and medical history, it may be recommended that you also have a yearly breast X-ray (mammogram).  If you  have a family history of breast cancer, talk with your health care provider about genetic screening.  If you are at high risk for breast cancer, talk with your health care provider about having an MRI and a mammogram every year.  Breast cancer (BRCA) gene test is recommended for women who have family members with BRCA-related cancers. Results of the assessment will determine the need for genetic counseling and BRCA1 and for BRCA2 testing. BRCA-related cancers include these types: ? Breast. This occurs in males or females. ? Ovarian. ? Tubal. This may also be called fallopian tube cancer. ? Cancer of the abdominal or pelvic lining (peritoneal cancer). ? Prostate. ? Pancreatic.  Cervical, Uterine, and Ovarian Cancer Your health care provider may recommend that you be screened regularly for cancer of the pelvic organs. These include your ovaries, uterus, and vagina. This screening involves a pelvic exam, which includes checking for microscopic changes to the surface of your cervix (Pap test).  For women ages 21-65, health care providers may recommend a pelvic exam and a Pap test every three years. For women ages 79-65, they may recommend the Pap test and pelvic exam, combined with testing for human papilloma virus (HPV), every five years. Some types of HPV increase your risk of cervical cancer. Testing for HPV may also be done on women of any age who have unclear Pap test results.  Other health care providers may not recommend any screening for nonpregnant women who are considered low risk for pelvic cancer and have no symptoms. Ask your health care provider if a screening pelvic exam is right for you.  If you have had past treatment for cervical cancer or a condition that could lead to cancer, you need Pap tests and screening for cancer for at least 20 years after your treatment. If Pap tests have been discontinued for you, your risk factors (such as having a new sexual partner) need to be  reassessed to determine if you should start having screenings again. Some women have medical problems that increase the chance of getting cervical cancer. In these cases, your health care provider may recommend that you have screening and Pap tests more often.  If you have a family history of uterine cancer or ovarian cancer, talk with your health care provider about genetic screening.  If you have vaginal bleeding after reaching menopause, tell your health care provider.  There are currently no reliable tests available to screen for ovarian cancer.  Lung Cancer Lung cancer screening is recommended for adults 69-62 years old who are at high risk for lung cancer because of a history of smoking. A yearly low-dose CT scan of the lungs is recommended if you:  Currently smoke.  Have a history of at least 30 pack-years of smoking and you currently smoke or have quit within the past 15 years. A pack-year is smoking an average of one pack of cigarettes per day for one year.  Yearly screening should:  Continue until it has been 15 years since you quit.  Stop if you develop a health problem that would prevent you from having lung cancer treatment.  Colorectal Cancer  This type of cancer can be detected and can often be prevented.  Routine colorectal cancer screening usually begins at  age 42 and continues through age 45.  If you have risk factors for colon cancer, your health care provider may recommend that you be screened at an earlier age.  If you have a family history of colorectal cancer, talk with your health care provider about genetic screening.  Your health care provider may also recommend using home test kits to check for hidden blood in your stool.  A small camera at the end of a tube can be used to examine your colon directly (sigmoidoscopy or colonoscopy). This is done to check for the earliest forms of colorectal cancer.  Direct examination of the colon should be repeated every  5-10 years until age 71. However, if early forms of precancerous polyps or small growths are found or if you have a family history or genetic risk for colorectal cancer, you may need to be screened more often.  Skin Cancer  Check your skin from head to toe regularly.  Monitor any moles. Be sure to tell your health care provider: ? About any new moles or changes in moles, especially if there is a change in a mole's shape or color. ? If you have a mole that is larger than the size of a pencil eraser.  If any of your family members has a history of skin cancer, especially at a young age, talk with your health care provider about genetic screening.  Always use sunscreen. Apply sunscreen liberally and repeatedly throughout the day.  Whenever you are outside, protect yourself by wearing long sleeves, pants, a wide-brimmed hat, and sunglasses.  What should I know about osteoporosis? Osteoporosis is a condition in which bone destruction happens more quickly than new bone creation. After menopause, you may be at an increased risk for osteoporosis. To help prevent osteoporosis or the bone fractures that can happen because of osteoporosis, the following is recommended:  If you are 46-71 years old, get at least 1,000 mg of calcium and at least 600 mg of vitamin D per day.  If you are older than age 55 but younger than age 65, get at least 1,200 mg of calcium and at least 600 mg of vitamin D per day.  If you are older than age 54, get at least 1,200 mg of calcium and at least 800 mg of vitamin D per day.  Smoking and excessive alcohol intake increase the risk of osteoporosis. Eat foods that are rich in calcium and vitamin D, and do weight-bearing exercises several times each week as directed by your health care provider. What should I know about how menopause affects my mental health? Depression may occur at any age, but it is more common as you become older. Common symptoms of depression  include:  Low or sad mood.  Changes in sleep patterns.  Changes in appetite or eating patterns.  Feeling an overall lack of motivation or enjoyment of activities that you previously enjoyed.  Frequent crying spells.  Talk with your health care provider if you think that you are experiencing depression. What should I know about immunizations? It is important that you get and maintain your immunizations. These include:  Tetanus, diphtheria, and pertussis (Tdap) booster vaccine.  Influenza every year before the flu season begins.  Pneumonia vaccine.  Shingles vaccine.  Your health care provider may also recommend other immunizations. This information is not intended to replace advice given to you by your health care provider. Make sure you discuss any questions you have with your health care provider. Document Released: 05/30/2005  Document Revised: 10/26/2015 Document Reviewed: 01/09/2015 Elsevier Interactive Patient Education  [address]. Lichen Sclerosus Lichen sclerosus is a skin problem. It can happen on any part of the body, but it commonly involves the anal or genital areas. It can cause itching and discomfort in these areas. Treatment can help to control symptoms. When the genital area is affected, getting treatment is important because the condition can cause scarring that may lead to other problems. What are the causes? The cause of this condition is not known. It could be the result of an overactive immune system or a lack of certain hormones. Lichen sclerosus is not an infection or a fungus. It is not passed from one person to another (not contagious). What increases the risk? This condition is more likely to develop in women, usually after menopause. What are the signs or symptoms? Symptoms of this condition include:  Thin, wrinkled, white areas on the skin.  Thickened white areas on the skin.  Red and swollen patches (lesions) on the skin.  Tears or cracks  in the skin.  Bruising.  Blood blisters.  Severe itching.  You may also have pain, itching, or burning with urination. Constipation is also common in people with lichen sclerosus. How is this diagnosed? This condition may be diagnosed with a physical exam. In some cases, a tissue sample (biopsy sample) may be removed to be looked at under a microscope. How is this treated? This condition is usually treated with medicated creams or ointments (topical steroids) that are applied over the affected areas. Follow these instructions at home:  Take over-the-counter and prescription medicines only as told by your health care provider.  Use creams or ointments as told by your health care provider.  Do not scratch the affected areas of skin.  Women should keep the vaginal area as clean and dry as possible.  Keep all follow-up visits as told by your health care provider. This is important. Contact a health care provider if:  You have increasing redness, swelling, or pain in the affected area.  You have fluid, blood, or pus coming from the affected area.  You have new lesions on your skin.  You have a fever.  You have pain during sex. This information is not intended to replace advice given to you by your health care provider. Make sure you discuss any questions you have with your health care provider. Document Released: 08/28/2010 Document Revised: 09/13/2015 Document Reviewed: 07/03/2014 Elsevier Interactive Patient Education  [employer].

## 2017-01-28 NOTE — Progress Notes (Signed)
Michelle Coleman [birth year] [id number]    History:    Presents for breast and pelvic exam. 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 Dellis Filbert. Hypertension and GERD managed by primary care. Normal mammogram and Pap history. 2016 benign breast biopsy. Vaccines are current. 2011 negative colonoscopy. Postmenopausal on no HRT with no bleeding . Not sexually active, husbands health. Chronic knee pain and low back pain from arthritis making exercise difficult. Reports normal DEXA at [facility] several years ago.  Past medical history, past surgical history, family history and social history were all reviewed and documented in the EPIC chart. Lives part of the year in [place]. Husband history of bladder cancer has had problems this past year with sepsis and urinary frequency. 2 children both doing well. Retired Engineer, maintenance (IT).  ROS:  A ROS was performed and pertinent positives and negatives are included.  Exam:  Vitals:   01/28/17 1019  BP: 128/80  Weight: 226 lb (102.5 kg)  Height: 5\' 5"  (1.651 m)   Body mass index is 37.61 kg/m.   General appearance:  Normal Thyroid:  Symmetrical, normal in size, without palpable masses or nodularity. Respiratory  Auscultation:  Clear without wheezing or rhonchi Cardiovascular  Auscultation:  Regular rate, without rubs, murmurs or gallops  Edema/varicosities:  Not grossly evident Abdominal  Soft,nontender, without masses, guarding or rebound.  Liver/spleen:  No organomegaly noted  Hernia:  None appreciated  Skin  Inspection:  Grossly normal   Breasts: Examined lying and sitting.     Right: Without masses, retractions, discharge or axillary adenopathy.     Left: Without masses, retractions, discharge or axillary adenopathy. Gentitourinary   Inguinal/mons:  Normal without inguinal adenopathy  External genitalia:  Lichen sclerosis at clitoral hood and at introitus BUS/Urethra/Skene's glands:  Normal  Vagina:   Normal  Cervix:  Normal  Uterus:   normal in size, shape and contour.  Midline and mobile  Adnexa/parametria:     Rt: Without masses or tenderness.   Lt: Without masses or tenderness.  Anus and perineum: Normal  Digital rectal exam: Normal sphincter tone without palpated masses or tenderness  Assessment/Plan:  71 y.o. MWF G2 P2 for breast and pelvic exam.  Postmenopausal/no HRT/no bleeding Hypertension/GERD-primary care manages labs and meds Lichen sclerosis  Plan: Temovate ointment prescription refill, proper use given and reviewed, has used small amount daily at night for 2 weeks and is now using twice weekly. Aware of importance of using sparingly. SBE's , continue annual screening mammogram, calcium rich diet, vitamin D 2000 daily encouraged. Water aerobics, beginners yoga and exercise encouraged. Home safety, fall prevention reviewed. Normal Pap history, new screening guidelines reviewed. Repeat DEXA, will schedule.    Huel Cote [facility], 11:04 AM 01/28/2017

## 2017-06-16 ENCOUNTER — Encounter: Payer: Self-pay | Admitting: Women's Health

## 2017-08-25 ENCOUNTER — Encounter (HOSPITAL_COMMUNITY): Payer: Self-pay | Admitting: Emergency Medicine

## 2017-08-25 ENCOUNTER — Other Ambulatory Visit: Payer: Self-pay

## 2017-08-25 ENCOUNTER — Observation Stay (HOSPITAL_COMMUNITY)
Admission: EM | Admit: 2017-08-25 | Discharge: 2017-08-27 | Disposition: A | Discharge status: Home / Self Care | Payer: Medicare Other | Attending: Internal Medicine | Admitting: Internal Medicine

## 2017-08-25 ENCOUNTER — Emergency Department (HOSPITAL_COMMUNITY): Payer: Medicare Other

## 2017-08-25 DIAGNOSIS — K5732 Diverticulitis of large intestine without perforation or abscess without bleeding: Secondary | ICD-10-CM | POA: Diagnosis not present

## 2017-08-25 DIAGNOSIS — N182 Chronic kidney disease, stage 2 (mild): Secondary | ICD-10-CM | POA: Diagnosis not present

## 2017-08-25 DIAGNOSIS — Z885 Allergy status to narcotic agent status: Secondary | ICD-10-CM | POA: Insufficient documentation

## 2017-08-25 DIAGNOSIS — I10 Essential (primary) hypertension: Secondary | ICD-10-CM | POA: Diagnosis present

## 2017-08-25 DIAGNOSIS — M1990 Unspecified osteoarthritis, unspecified site: Secondary | ICD-10-CM | POA: Insufficient documentation

## 2017-08-25 DIAGNOSIS — K219 Gastro-esophageal reflux disease without esophagitis: Secondary | ICD-10-CM | POA: Insufficient documentation

## 2017-08-25 DIAGNOSIS — K5792 Diverticulitis of intestine, part unspecified, without perforation or abscess without bleeding: Secondary | ICD-10-CM | POA: Diagnosis present

## 2017-08-25 DIAGNOSIS — Z79899 Other long term (current) drug therapy: Secondary | ICD-10-CM | POA: Insufficient documentation

## 2017-08-25 DIAGNOSIS — I129 Hypertensive chronic kidney disease with stage 1 through stage 4 chronic kidney disease, or unspecified chronic kidney disease: Secondary | ICD-10-CM | POA: Insufficient documentation

## 2017-08-25 DIAGNOSIS — Z9049 Acquired absence of other specified parts of digestive tract: Secondary | ICD-10-CM | POA: Diagnosis not present

## 2017-08-25 DIAGNOSIS — Z6841 Body Mass Index (BMI) 40.0 and over, adult: Secondary | ICD-10-CM | POA: Insufficient documentation

## 2017-08-25 DIAGNOSIS — E669 Obesity, unspecified: Secondary | ICD-10-CM | POA: Diagnosis not present

## 2017-08-25 DIAGNOSIS — R1032 Left lower quadrant pain: Secondary | ICD-10-CM

## 2017-08-25 HISTORY — DX: Chronic kidney disease, stage 3 unspecified: N18.30

## 2017-08-25 HISTORY — DX: Unspecified osteoarthritis, unspecified site: M19.90

## 2017-08-25 HISTORY — DX: Personal history of other diseases of the digestive system: Z87.19

## 2017-08-25 HISTORY — DX: Migraine, unspecified, not intractable, without status migrainosus: G43.909

## 2017-08-25 HISTORY — DX: Nausea with vomiting, unspecified: R11.2

## 2017-08-25 HISTORY — DX: Nausea with vomiting, unspecified: Z98.890

## 2017-08-25 HISTORY — DX: Chronic kidney disease, stage 3 (moderate): N18.3

## 2017-08-25 LAB — COMPREHENSIVE METABOLIC PANEL
ALT: 13 U/L — ABNORMAL LOW (ref 14–54)
AST: 16 U/L (ref 15–41)
Albumin: 3.6 g/dL (ref 3.5–5.0)
Alkaline Phosphatase: 67 U/L (ref 38–126)
Anion gap: 8 (ref 5–15)
BUN: 17 mg/dL (ref 6–20)
CHLORIDE: 105 mmol/L (ref 101–111)
CO2: 27 mmol/L (ref 22–32)
Calcium: 9.5 mg/dL (ref 8.9–10.3)
Creatinine, Ser: 0.99 mg/dL (ref 0.44–1.00)
GFR calc Af Amer: >60 mL/min (ref >60)
GFR, EST NON AFRICAN AMERICAN: 56 mL/min — AB (ref >60)
Glucose, Bld: 96 mg/dL (ref 65–99)
POTASSIUM: 4.6 mmol/L (ref 3.5–5.1)
Sodium: 140 mmol/L (ref 135–145)
TOTAL PROTEIN: 7.2 g/dL (ref 6.5–8.1)
Total Bilirubin: 0.8 mg/dL (ref 0.3–1.2)

## 2017-08-25 LAB — CBC WITH DIFFERENTIAL/PLATELET
BASOS ABS: 0 10*3/uL (ref 0.0–0.1)
BASOS PCT: 0 %
EOS PCT: 1 %
Eosinophils Absolute: 0.1 10*3/uL (ref 0.0–0.7)
HCT: 44.3 % (ref 36.0–46.0)
Hemoglobin: 14.8 g/dL (ref 12.0–15.0)
Lymphocytes Relative: 16 %
Lymphs Abs: 2.1 10*3/uL (ref 0.7–4.0)
MCH: 29.8 pg (ref 26.0–34.0)
MCHC: 33.4 g/dL (ref 30.0–36.0)
MCV: 89.1 fL (ref 78.0–100.0)
MONO ABS: 0.9 10*3/uL (ref 0.1–1.0)
Monocytes Relative: 7 %
Neutro Abs: 9.9 10*3/uL — ABNORMAL HIGH (ref 1.7–7.7)
Neutrophils Relative %: 76 %
PLATELETS: 212 10*3/uL (ref 150–400)
RBC: 4.97 MIL/uL (ref 3.87–5.11)
RDW: 14.6 % (ref 11.5–15.5)
WBC: 13 10*3/uL — ABNORMAL HIGH (ref 4.0–10.5)

## 2017-08-25 LAB — LIPASE, BLOOD: Lipase: 29 U/L (ref 11–51)

## 2017-08-25 LAB — I-STAT CG4 LACTIC ACID, ED: Lactic Acid, Venous: 1.55 mmol/L (ref 0.5–1.9)

## 2017-08-25 MED ORDER — CIPROFLOXACIN HCL 500 MG PO TABS
500.0000 mg | ORAL_TABLET | Freq: Once | ORAL | Status: AC
  Administered 2017-08-25: 500 mg via ORAL
  Filled 2017-08-25: qty 1

## 2017-08-25 MED ORDER — IOHEXOL 300 MG/ML  SOLN
100.0000 mL | Freq: Once | INTRAMUSCULAR | Status: AC | PRN
Start: 2017-08-25 — End: 2017-08-25
  Administered 2017-08-25: 100 mL via INTRAVENOUS

## 2017-08-25 MED ORDER — HYDROMORPHONE HCL 2 MG/ML IJ SOLN
0.5000 mg | Freq: Once | INTRAMUSCULAR | Status: AC
  Administered 2017-08-25: 0.5 mg via INTRAVENOUS
  Filled 2017-08-25: qty 1

## 2017-08-25 MED ORDER — SODIUM CHLORIDE 0.9 % IV BOLUS
500.0000 mL | Freq: Once | INTRAVENOUS | Status: AC
  Administered 2017-08-25: 500 mL via INTRAVENOUS

## 2017-08-25 MED ORDER — METRONIDAZOLE 500 MG PO TABS
500.0000 mg | ORAL_TABLET | Freq: Once | ORAL | Status: AC
  Administered 2017-08-25: 500 mg via ORAL
  Filled 2017-08-25: qty 1

## 2017-08-25 MED ORDER — ONDANSETRON HCL 4 MG/2ML IJ SOLN
4.0000 mg | Freq: Once | INTRAMUSCULAR | Status: AC
  Administered 2017-08-25: 4 mg via INTRAVENOUS
  Filled 2017-08-25: qty 2

## 2017-08-25 NOTE — ED Notes (Signed)
RN called lab to follow up urinalysis result, lab advised RN that they did not receive the urine specimen collected at triage .

## 2017-08-25 NOTE — ED Provider Notes (Signed)
[place] EMERGENCY DEPARTMENT Provider Note   CSN: [id number] Arrival date & time: 08/25/17  1543     History   Chief Complaint Chief Complaint  Patient presents with  . Abdominal Pain  . Fever  . Chills  . Diarrhea    HPI Michelle Coleman is a 72 y.o. female.  Michelle Coleman is a 72 y.o. Female with history of diverticulitis, cholecystectomy, hypertension, GERD, arthritis, presents to the ED for evaluation of periumbilical and left lower quadrant abdominal pain that started 3 days ago.  Patient reports pain started as mild abdominal discomfort Sunday afternoon but has been getting progressively worse and is now a constant ache with intermittent sharp pains in the left lower quadrant.  Patient reports one episode of nonbloody diarrhea, since then she has had normal bowel movements.  She denies nausea or vomiting.  She reports chills and a low-grade fever of 99.9 on arrival.  Patient denies any dysuria, frequency or hematuria.  Patient reports one previous episode of diverticulitis, with similar pain that was more focused in the left lower quadrant.  She reports she did have abscess with this episode of diverticulitis and required admission to the hospital.  Patient denies any chest pain or shortness of breath.     Past Medical History:  Diagnosis Date  . Hx of cholecystectomy   . 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 Skin abnormalities 25/0539   lichens sclerosis, kerotosis ( 11/2006)    Patient Active Problem List   Diagnosis Date Noted  . Chest pain 07/02/2012  . Hematuria 06/30/2012  . Skin abnormalities   . Obesity, unspecified 08/23/2008  . ARTHRITIS 08/23/2008  . HYPERTENSION 08/18/2008  . GERD 08/18/2008  . DIVERTICULOSIS OF COLON 08/18/2008  . DIVERTICULITIS, COLON, WITH PERFORATION 08/18/2008  . RENAL CYST, LEFT 08/18/2008    Past Surgical History:  Procedure Laterality Date  . BUNIONECTOMY  1984  . CHOLECYSTECTOMY  1992  . facial cyst removal   1983  . ROTATOR CUFF REPAIR  Jan 2012  . TONSILLECTOMY AND ADENOIDECTOMY  1950     OB History    Gravida  2   Para  2   Term      Preterm      AB      Living  2     SAB      TAB      Ectopic      Multiple      Live Births               Home Medications    Prior to Admission medications   Medication Sig Start Date End Date Taking? Authorizing Provider  clobetasol ointment (TEMOVATE) 7.67 % Apply 1 application topically 2 (two) times daily. Patient taking differently: Apply 1 application topically as needed.  01/28/17  Yes Huel Cote, NP  lisinopril (PRINIVIL,ZESTRIL) 40 MG tablet Take 40 mg by mouth daily.   Yes [provider]  omeprazole (PRILOSEC OTC) 20 MG tablet Take 20 mg by mouth daily.     Yes [provider]  spironolactone (ALDACTONE) 25 MG tablet Take 25 mg by mouth daily.   Yes [provider]    Family History Family History  Problem Relation Age of Onset  . Hypertension Mother   . Heart disease Mother   . Stroke Mother   . Uterine cancer Maternal Grandmother   . Stomach cancer Maternal Grandfather   . Heart disease Maternal Uncle   .  Colon cancer Neg Hx     Social History Social History   Tobacco Use  . Smoking status: Never Smoker  . Smokeless tobacco: Never Used  Substance Use Topics  . Alcohol use: No  . Drug use: No     Allergies   Codeine and Morphine   Review of Systems Review of Systems  Constitutional: Positive for chills. Negative for fever.  HENT: Negative for congestion, rhinorrhea and sore throat.   Eyes: Negative for visual disturbance.  Respiratory: Negative for cough and shortness of breath.   Cardiovascular: Negative for chest pain.  Gastrointestinal: Positive for abdominal pain and diarrhea. Negative for blood in stool, constipation, nausea and vomiting.  Genitourinary: Negative for dysuria and frequency.  Musculoskeletal: Negative for arthralgias and myalgias.  Skin:  Negative for color change and rash.  Neurological: Negative for dizziness, weakness and numbness.     Physical Exam Updated Vital Signs BP 140/64 (BP Location: Left Arm) Comment: Simultaneous filing. User may not have seen previous data.  Pulse 85   Temp 98.5 F (36.9 C) (Oral)   Resp 17   Ht 5\' 5"  (1.651 m)   Wt 99.8 kg (220 lb)   LMP 02/13/1996   SpO2 96%   BMI 36.61 kg/m   Physical Exam  Constitutional: She appears well-developed and well-nourished.  Non-toxic appearance. She does not appear ill. No distress.  Patient appears mildly uncomfortable but is in no acute distress  HENT:  Head: Normocephalic and atraumatic.  Eyes: Right eye exhibits no discharge. Left eye exhibits no discharge.  Cardiovascular: Normal rate, regular rhythm, normal heart sounds and intact distal pulses.  Pulmonary/Chest: Effort normal and breath sounds normal. No respiratory distress.  Respirations equal and unlabored, patient able to speak in full sentences, lungs clear to auscultation bilaterally  Abdominal: Normal appearance and bowel sounds are normal. She exhibits no distension and no mass. There is tenderness in the periumbilical area and left lower quadrant. There is guarding. There is no rigidity, no rebound, no CVA tenderness, no tenderness at McBurney's point and negative Murphy's sign. No hernia.  Abdomen soft, nondistended, bowel sounds present throughout, focal tenderness in the periumbilical and left lower quadrant with guarding, no rebound tenderness  Neurological: She is alert. Coordination normal.  Skin: Skin is warm and dry. Capillary refill takes less than 2 seconds. She is not diaphoretic.  Psychiatric: She has a normal mood and affect. Her behavior is normal.  Nursing note and vitals reviewed.    ED Treatments / Results  Labs (all labs ordered are listed, but only abnormal results are displayed) Labs Reviewed  CBC WITH DIFFERENTIAL/PLATELET - Abnormal; Notable for the following  components:      Result Value   WBC 13.0 (*)    Neutro Abs 9.9 (*)    All other components within normal limits  COMPREHENSIVE METABOLIC PANEL - Abnormal; Notable for the following components:   ALT 13 (*)    GFR calc non Af Amer 56 (*)    All other components within normal limits  LIPASE, BLOOD  URINALYSIS, ROUTINE W REFLEX MICROSCOPIC  I-STAT CG4 LACTIC ACID, ED    EKG None  Radiology Ct Abdomen Pelvis W Contrast  Result Date: 08/25/2017 CLINICAL DATA:  Periumbilical pain radiating down to the suprapubic region and lower back. EXAM: CT ABDOMEN AND PELVIS WITH CONTRAST TECHNIQUE: Multidetector CT imaging of the abdomen and pelvis was performed using the standard protocol following bolus administration of intravenous contrast. CONTRAST:  141mL OMNIPAQUE IOHEXOL 300 MG/ML  SOLN COMPARISON:  11/29/2014, 08/04/2012 FINDINGS: Lower chest: Normal size heart. No pericardial effusion. Bibasilar dependent atelectasis. No acute abnormality. Hepatobiliary: Redemonstration of hepatic steatosis and right hepatic vascular malformation. No enhancing mass lesions. Status post cholecystectomy. No ductal dilatation. Pancreas: Negative Spleen: Stable 11 mm hypodensity in the spleen consistent with a benign finding given stability since 2014. No splenomegaly. Adrenals/Urinary Tract: Normal right adrenal gland. Stable nodularity of the left adrenal gland unchanged. Parapelvic bilateral renal cysts without nephrolithiasis nor obstructive uropathy. No enhancing mass lesions of either kidney. The urinary bladder is physiologically distended without focal mural thickening or calculus. Stomach/Bowel: Acute diverticulitis with pericolonic inflammation along the distal descending and proximal sigmoid colon. Diverticulosis and circular muscle hypertrophy further distally in the mid to distal sigmoid. No bowel obstruction, abscess nor free air. The stomach and small intestine are nonacute. Vascular/Lymphatic: Mild aortoiliac  atherosclerosis. Patent branch vessels. No aneurysm or dissection. Reproductive: Uterus and bilateral adnexa are unremarkable. Other: No abdominal wall hernia or abnormality. No abdominopelvic ascites. Musculoskeletal: No aggressive osseous lesions nor fracture. Degenerative disc disease T10-11 at L5-S1. IMPRESSION: 1. Acute uncomplicated diverticulitis at the junction of the descending and sigmoid colon. 2. 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 3. Bilateral parapelvic renal cysts.  No obstructive uropathy. 4. 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 Electronically Signed   By: Ashley Royalty M.D.   On: 08/25/2017 19:26    Procedures Procedures (including critical care time)  Medications Ordered in ED Medications  sodium chloride 0.9 % bolus 500 mL (500 mLs Intravenous New Bag/Given 08/25/17 2203)  ciprofloxacin (CIPRO) tablet 500 mg (has no administration in time range)  metroNIDAZOLE (FLAGYL) tablet 500 mg (has no administration in time range)  iohexol (OMNIPAQUE) 300 MG/ML solution 100 mL (100 mLs Intravenous Contrast Given 08/25/17 1825)  HYDROmorphone (DILAUDID) injection 0.5 mg (0.5 mg Intravenous Given 08/25/17 2204)  ondansetron (ZOFRAN) injection 4 mg (4 mg Intravenous Given 08/25/17 2204)     Initial Impression / Assessment and Plan / ED Course  I have reviewed the triage vital signs and the nursing notes.  Pertinent labs & imaging results that were available during my care of the patient were reviewed by me and considered in my medical decision making (see chart for details).  Patient presents to the ED for evaluation of 3 days of periumbilical and left lower quadrant pain, chills and low-grade fever, no nausea or vomiting, one episode of diarrhea, no melena or hematochezia.  History of previous  diverticulitis with abscess.  Patient with low-grade fever of 99.9 on arrival vitals otherwise unremarkable, temperature normalized without intervention.  No tachycardia, patient does not meet sirs criteria.  Patient with focal abdominal tenderness with guarding in the periumbilical and left lower quadrant, all other quadrants unremarkable.  Evaluation initiated via quick look pathway, labs significant for a leukocytosis of 13 with mild left shift, normal hemoglobin, no electrolyte derangements, normal renal and liver function, normal lipase.  Lactic is within normal range.  Urinalysis is pending.  CT scan significant for acute uncomplicated diverticulitis at the junction of the descending and sigmoid colon.  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  Bilateral parapelvic renal cysts with no obstructive uropathy, there is a stable 11 mm hypodensity in the spleen that is unchanged when compared to 2014 scan.  Discussed these  results with the patient, she continues to report pain, will treat with IV fluids, patient reports severe dizziness and nausea with morphine will treat with 0.5 mg of Dilaudid and Zofran.  Will then try for p.o. challenge with antibiotics and fluids.  Discussed with Dr. Tomi Bamberger who saw and evaluated the patient as well and is in agreement with plan.  Patient became dizzy and woozy with pain medication, she was able to tolerate antibiotics here, but given she had such as reaction to even a very small dose of pain medicine, I am concerned to send her home be potentially this symptomatic with pain medications versus not having her pain under good control and not being able to tolerate her antibiotics.  We will consult hospitalist for admission.  Spoke with Dr. Hal Hope, who will see and admit the patient.  Final Clinical Impressions(s) / ED Diagnoses   Final diagnoses:  Left lower quadrant pain    Diverticulitis large intestine w/o perforation or abscess w/o bleeding    ED Discharge Orders    None       Janet Berlin 08/26/17 4709    Dorie Rank, MD 08/28/17 2024

## 2017-08-25 NOTE — ED Notes (Signed)
Pt drinking cup of water, tolerated PO medications

## 2017-08-25 NOTE — ED Triage Notes (Signed)
Pt presents with abd pain around the umbilicus and radiates down to the suprapubic area and to the lower back; pt states she has hx of diverticulitis; pt denies urinary symptoms, denies n/v, one episode of diarrhea on Sunday but small solid BMs since; pt also reports feeling feverish and chills

## 2017-08-25 NOTE — ED Provider Notes (Signed)
Patient placed in Quick Look pathway, seen and evaluated   Chief Complaint: Mid abdominal pain with radiation to the left lower quadrant  HPI:   Patient with history of diverticulitis, cholecystectomy, no other abdominal surgeries --presents with complaint of gradual onset mid abdominal pain, slightly worse on the left lower abdomen.  Patient has had some chills and temperature to 99.9 Fahrenheit upon emergency department arrival.  She denies nausea or vomiting.  Patient had one episode of diarrhea at onset without blood.  She denies any dysuria or hematuria.  Previous episode of diverticulitis was more focused in the left lower quadrant.  ROS:  Positive ROS: (+) Abdominal pain, chills, diarrhea Negative ROS: (-) Fever greater than 100.4 F, vomiting, dysuria, medication  Physical Exam:   Gen: No distress  Neuro: Awake and Alert  Skin: Warm    Focused Exam: Heart RRR, nml S1,S2, no m/r/g; Lungs CTAB; Abd soft, mild to moderate tenderness in the suprapubic and periumbilical area as well as the left lower quadrant, no rebound or guarding; Ext 2+ pedal pulses bilaterally, no edema.  BP (!) 164/78 (BP Location: Right Arm)   Pulse 92   Temp 99.9 F (37.7 C) (Oral)   Resp 14   Ht 5\' 5"  (1.651 m)   Wt 99.8 kg (220 lb)   LMP 02/13/1996   SpO2 97%   BMI 36.61 kg/m   Plan: Patient with abdominal pain, CT ordered given borderline temperature, age, with history of diverticulitis although symptoms not exactly similar to previous episode.  Lab work pending as well.  Initiation of care has begun. The patient has been counseled on the process, plan, and necessity for staying for the completion/evaluation, and the remainder of the medical screening examination    Carlisle Cater, Hershal Coria 08/25/17 1628    Noemi Chapel, MD 08/26/17 [phone]

## 2017-08-26 ENCOUNTER — Other Ambulatory Visit: Payer: Self-pay

## 2017-08-26 ENCOUNTER — Encounter (HOSPITAL_COMMUNITY): Payer: Self-pay | Admitting: Internal Medicine

## 2017-08-26 DIAGNOSIS — K5732 Diverticulitis of large intestine without perforation or abscess without bleeding: Secondary | ICD-10-CM | POA: Diagnosis not present

## 2017-08-26 DIAGNOSIS — K5792 Diverticulitis of intestine, part unspecified, without perforation or abscess without bleeding: Secondary | ICD-10-CM | POA: Diagnosis not present

## 2017-08-26 DIAGNOSIS — I10 Essential (primary) hypertension: Secondary | ICD-10-CM | POA: Diagnosis not present

## 2017-08-26 LAB — URINALYSIS, ROUTINE W REFLEX MICROSCOPIC
Bacteria, UA: NONE SEEN
Bilirubin Urine: NEGATIVE
GLUCOSE, UA: NEGATIVE mg/dL
HGB URINE DIPSTICK: NEGATIVE
Ketones, ur: 5 mg/dL — AB
NITRITE: NEGATIVE
Protein, ur: NEGATIVE mg/dL
Specific Gravity, Urine: >1.046 — ABNORMAL HIGH (ref 1.005–1.030)
pH: 5 (ref 5.0–8.0)

## 2017-08-26 LAB — CBG MONITORING, ED
GLUCOSE-CAPILLARY: 97 mg/dL (ref 65–99)
Glucose-Capillary: 147 mg/dL — ABNORMAL HIGH (ref 65–99)

## 2017-08-26 LAB — HEPATIC FUNCTION PANEL
ALT: 14 U/L (ref 14–54)
AST: 16 U/L (ref 15–41)
Albumin: 3 g/dL — ABNORMAL LOW (ref 3.5–5.0)
Alkaline Phosphatase: 58 U/L (ref 38–126)
BILIRUBIN DIRECT: 0.2 mg/dL (ref 0.1–0.5)
Indirect Bilirubin: 0.9 mg/dL (ref 0.3–0.9)
Total Bilirubin: 1.1 mg/dL (ref 0.3–1.2)
Total Protein: 6.1 g/dL — ABNORMAL LOW (ref 6.5–8.1)

## 2017-08-26 LAB — CBC
HEMATOCRIT: 40.1 % (ref 36.0–46.0)
HEMOGLOBIN: 13.4 g/dL (ref 12.0–15.0)
MCH: 29.7 pg (ref 26.0–34.0)
MCHC: 33.4 g/dL (ref 30.0–36.0)
MCV: 88.9 fL (ref 78.0–100.0)
Platelets: 177 10*3/uL (ref 150–400)
RBC: 4.51 MIL/uL (ref 3.87–5.11)
RDW: 14.6 % (ref 11.5–15.5)
WBC: 13.5 10*3/uL — AB (ref 4.0–10.5)

## 2017-08-26 LAB — BASIC METABOLIC PANEL
ANION GAP: 7 (ref 5–15)
BUN: 17 mg/dL (ref 6–20)
CO2: 25 mmol/L (ref 22–32)
Calcium: 8.7 mg/dL — ABNORMAL LOW (ref 8.9–10.3)
Chloride: 104 mmol/L (ref 101–111)
Creatinine, Ser: 0.89 mg/dL (ref 0.44–1.00)
GFR calc Af Amer: >60 mL/min (ref >60)
Glucose, Bld: 140 mg/dL — ABNORMAL HIGH (ref 65–99)
POTASSIUM: 4.1 mmol/L (ref 3.5–5.1)
SODIUM: 136 mmol/L (ref 135–145)

## 2017-08-26 MED ORDER — ONDANSETRON HCL 4 MG PO TABS: 4.0000 mg | ORAL_TABLET | Freq: Four times a day (QID) | ORAL | Status: DC | PRN

## 2017-08-26 MED ORDER — ACETAMINOPHEN 650 MG RE SUPP: 650.0000 mg | Freq: Four times a day (QID) | RECTAL | Status: DC | PRN

## 2017-08-26 MED ORDER — DEXTROSE-NACL 5-0.9 % IV SOLN
INTRAVENOUS | Status: AC
  Administered 2017-08-26 (×2): via INTRAVENOUS

## 2017-08-26 MED ORDER — HYDRALAZINE HCL 20 MG/ML IJ SOLN: 10.0000 mg | INTRAMUSCULAR | Status: DC | PRN

## 2017-08-26 MED ORDER — ENOXAPARIN SODIUM 40 MG/0.4ML ~~LOC~~ SOLN
40.0000 mg | SUBCUTANEOUS | Status: DC
  Administered 2017-08-26: 40 mg via SUBCUTANEOUS
  Filled 2017-08-26: qty 0.4

## 2017-08-26 MED ORDER — CIPROFLOXACIN IN D5W 400 MG/200ML IV SOLN
400.0000 mg | Freq: Two times a day (BID) | INTRAVENOUS | Status: DC
  Administered 2017-08-26 (×2): 400 mg via INTRAVENOUS
  Filled 2017-08-26 (×3): qty 200

## 2017-08-26 MED ORDER — ONDANSETRON HCL 4 MG/2ML IJ SOLN: 4.0000 mg | Freq: Four times a day (QID) | INTRAMUSCULAR | Status: DC | PRN

## 2017-08-26 MED ORDER — KETOROLAC TROMETHAMINE 15 MG/ML IJ SOLN
15.0000 mg | Freq: Four times a day (QID) | INTRAMUSCULAR | Status: DC | PRN
  Administered 2017-08-26: 15 mg via INTRAVENOUS
  Filled 2017-08-26: qty 1

## 2017-08-26 MED ORDER — METRONIDAZOLE IN NACL 5-0.79 MG/ML-% IV SOLN
500.0000 mg | Freq: Three times a day (TID) | INTRAVENOUS | Status: DC
Start: 2017-08-26 — End: 2017-08-27
  Administered 2017-08-26 – 2017-08-27 (×4): 500 mg via INTRAVENOUS
  Filled 2017-08-26 (×5): qty 100

## 2017-08-26 MED ORDER — ACETAMINOPHEN 325 MG PO TABS
650.0000 mg | ORAL_TABLET | Freq: Four times a day (QID) | ORAL | Status: DC | PRN
  Administered 2017-08-26 – 2017-08-27 (×3): 650 mg via ORAL
  Filled 2017-08-26 (×3): qty 2

## 2017-08-26 NOTE — ED Notes (Signed)
Pt had jello and grape juice before taking CBG

## 2017-08-26 NOTE — Progress Notes (Signed)
PROGRESS NOTE        PATIENT DETAILS Name: Michelle Coleman Age: 72 y.o. Sex: female Date of Birth: [birth year] Admit Date: 08/25/2017 Admitting Physician No admitting provider for patient encounter. [id number], Legrand Como, MD  Brief Narrative: Patient is a 72 y.o. female with prior history of diverticulitis, hypertension presented to the ED with mostly left lower quadrant abdominal pain.  She was found to have acute diverticulitis and subsequently admitted to the hospitalist service.  Subjective: Feels better-continues to have some mild pain in the left lower quadrant area.  Seems to be tolerating clear liquids.  Assessment/Plan: Acute diverticulitis: Continues to have mild pain-slowly advance diet.  Continue ciprofloxacin and Flagyl.  Will need outpatient GI/surgical evaluation-as this is a second episode.  Avoid narcotics as much as possible-she apparently got 1 dose of Dilaudid in the emergency room and became very lethargic and nauseous.  Hypertension: Blood pressure appears to be currently controlled-hopefully we can resume all of her antihypertensives on discharge.  Telemetry (independently reviewed): Sinus rhythm  DVT Prophylaxis: Prophylactic Lovenox   Code Status: Full code   Family Communication: None at bedside  Disposition Plan: Hopefully home on 5/9  Antimicrobial agents: Anti-infectives (From admission, onward)   Start     Dose/Rate Route Frequency Ordered Stop   08/26/17 1000  ciprofloxacin (CIPRO) IVPB 400 mg     400 mg 200 mL/hr over 60 Minutes Intravenous Every 12 hours 08/26/17 0236     08/26/17 0600  metroNIDAZOLE (FLAGYL) IVPB 500 mg     500 mg 100 mL/hr over 60 Minutes Intravenous Every 8 hours 08/26/17 0159     08/25/17 2245  ciprofloxacin (CIPRO) tablet 500 mg     500 mg Oral  Once 08/25/17 2238 08/25/17 2255   08/25/17 2245  metroNIDAZOLE (FLAGYL) tablet 500 mg     500 mg Oral  Once 08/25/17 2238 08/25/17 2255       Procedures: None  CONSULTS:  None  Time spent: 25 minutes-Greater than 50% of this time was spent in counseling, explanation of diagnosis, planning of further management, and coordination of care.  MEDICATIONS: Scheduled Meds: Continuous Infusions: . ciprofloxacin Stopped (08/26/17 1135)  . dextrose 5 % and 0.9% NaCl 60 mL/hr at 08/26/17 0827  . metronidazole Stopped (08/26/17 0865)   PRN Meds:.acetaminophen **OR** acetaminophen, hydrALAZINE, ketorolac, ondansetron **OR** ondansetron (ZOFRAN) IV   PHYSICAL EXAM: Vital signs: Vitals:   08/26/17 0600 08/26/17 0645 08/26/17 0828 08/26/17 0910  BP: (!) 113/59  (!) 101/58 109/62  Pulse: (!) 59 63  (!) 59  Resp: 15 16  15   Temp:      TempSrc:      SpO2: 96% 94%  93%  Weight:      Height:       Filed Weights   08/25/17 1624  Weight: 99.8 kg (220 lb)   Body mass index is 36.61 kg/m.   General appearance :Awake, alert, not in any distress. Speech Clear. Not toxic Looking Eyes:, pupils equally reactive to light and accomodation,no scleral icterus.Pink conjunctiva HEENT: Atraumatic and Normocephalic Neck: supple, no JVD. No cervical lymphadenopathy. No thyromegaly Resp:Good air entry bilaterally, no added sounds  CVS: S1 S2 regular, no murmurs.  GI: Bowel sounds present, mildly tender in the left quadrant area without any peritoneal signs. Extremities: B/L Lower Ext shows no edema, both legs are warm to touch Neurology:  speech clear,Non focal, sensation is grossly intact. Psychiatric: Normal judgment and insight. Alert and oriented x 3. Normal mood. Musculoskeletal:No digital cyanosis Skin:No Rash, warm and dry Wounds:N/A  I have personally reviewed following labs and imaging studies  LABORATORY DATA: CBC: Recent Labs  Lab 08/25/17 1623 08/26/17 0230  WBC 13.0* 13.5*  NEUTROABS 9.9*  --   HGB 14.8 13.4  HCT 44.3 40.1  MCV 89.1 88.9  PLT 212 332    Basic Metabolic Panel: Recent Labs  Lab  08/25/17 1623 08/26/17 0230  NA 140 136  K 4.6 4.1  CL 105 104  CO2 27 25  GLUCOSE 96 140*  BUN 17 17  CREATININE 0.99 0.89  CALCIUM 9.5 8.7*    GFR: Estimated Creatinine Clearance: 67.8 mL/min (by C-G formula based on SCr of 0.89 mg/dL).  Liver Function Tests: Recent Labs  Lab 08/25/17 1623 08/26/17 0230  AST 16 16  ALT 13* 14  ALKPHOS 67 58  BILITOT 0.8 1.1  PROT 7.2 6.1*  ALBUMIN 3.6 3.0*   Recent Labs  Lab 08/25/17 1623  LIPASE 29   No results for input(s): AMMONIA in the last 168 hours.  Coagulation Profile: No results for input(s): INR, PROTIME in the last 168 hours.  Cardiac Enzymes: No results for input(s): CKTOTAL, CKMB, CKMBINDEX, TROPONINI in the last 168 hours.  BNP (last 3 results) No results for input(s): PROBNP in the last 8760 hours.  HbA1C: No results for input(s): HGBA1C in the last 72 hours.  CBG: Recent Labs  Lab 08/26/17 0751  GLUCAP 147*    Lipid Profile: No results for input(s): CHOL, HDL, LDLCALC, TRIG, CHOLHDL, LDLDIRECT in the last 72 hours.  Thyroid Function Tests: No results for input(s): TSH, T4TOTAL, FREET4, T3FREE, THYROIDAB in the last 72 hours.  Anemia Panel: No results for input(s): VITAMINB12, FOLATE, FERRITIN, TIBC, IRON, RETICCTPCT in the last 72 hours.  Urine analysis:    Component Value Date/Time   COLORURINE YELLOW 08/25/2017 0451   APPEARANCEUR CLEAR 08/25/2017 0451   LABSPEC >1.046 (H) 08/25/2017 0451   PHURINE 5.0 08/25/2017 0451   GLUCOSEU NEGATIVE 08/25/2017 0451   HGBUR NEGATIVE 08/25/2017 0451   BILIRUBINUR NEGATIVE 08/25/2017 0451   KETONESUR 5 (A) 08/25/2017 0451   PROTEINUR NEGATIVE 08/25/2017 0451   UROBILINOGEN 0.2 06/30/2012 0914   NITRITE NEGATIVE 08/25/2017 0451   LEUKOCYTESUR TRACE (A) 08/25/2017 0451    Sepsis Labs: Lactic Acid, Venous    Component Value Date/Time   LATICACIDVEN 1.55 08/25/2017 1652    MICROBIOLOGY: No results found for this or any previous visit (from the  past 240 hour(s)).  RADIOLOGY STUDIES/RESULTS: Ct Abdomen Pelvis W Contrast  Result Date: 08/25/2017 CLINICAL DATA:  Periumbilical pain radiating down to the suprapubic region and lower back. EXAM: CT ABDOMEN AND PELVIS WITH CONTRAST TECHNIQUE: Multidetector CT imaging of the abdomen and pelvis was performed using the standard protocol following bolus administration of intravenous contrast. CONTRAST:  124mL OMNIPAQUE IOHEXOL 300 MG/ML  SOLN COMPARISON:  11/29/2014, 08/04/2012 FINDINGS: Lower chest: Normal size heart. No pericardial effusion. Bibasilar dependent atelectasis. No acute abnormality. Hepatobiliary: Redemonstration of hepatic steatosis and right hepatic vascular malformation. No enhancing mass lesions. Status post cholecystectomy. No ductal dilatation. Pancreas: Negative Spleen: Stable 11 mm hypodensity in the spleen consistent with a benign finding given stability since 2014. No splenomegaly. Adrenals/Urinary Tract: Normal right adrenal gland. Stable nodularity of the left adrenal gland unchanged. Parapelvic bilateral renal cysts without nephrolithiasis nor obstructive uropathy. No enhancing mass lesions of either kidney.  The urinary bladder is physiologically distended without focal mural thickening or calculus. Stomach/Bowel: Acute diverticulitis with pericolonic inflammation along the distal descending and proximal sigmoid colon. Diverticulosis and circular muscle hypertrophy further distally in the mid to distal sigmoid. No bowel obstruction, abscess nor free air. The stomach and small intestine are nonacute. Vascular/Lymphatic: Mild aortoiliac atherosclerosis. Patent branch vessels. No aneurysm or dissection. Reproductive: Uterus and bilateral adnexa are unremarkable. Other: No abdominal wall hernia or abnormality. No abdominopelvic ascites. Musculoskeletal: No aggressive osseous lesions nor fracture. Degenerative disc disease T10-11 at L5-S1. IMPRESSION: 1. Acute uncomplicated diverticulitis at  the junction of the descending and sigmoid colon. 2. 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 3. Bilateral parapelvic renal cysts.  No obstructive uropathy. 4. 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 Electronically Signed   By: Ashley Royalty M.D.   On: 08/25/2017 19:26     LOS: 0 days   Oren Binet, MD  [facility] Pager:[phone]  If 7PM-7AM, please contact night-coverage [email or web address] Password [facility] 08/26/2017, 11:36 AM

## 2017-08-26 NOTE — ED Notes (Signed)
IV team at [facility]

## 2017-08-26 NOTE — ED Notes (Signed)
Ordered lunch tray 

## 2017-08-26 NOTE — ED Notes (Signed)
Clear liquid tray ordered for breakfast

## 2017-08-26 NOTE — Progress Notes (Signed)
Pharmacy Antibiotic Note  Michelle Coleman is a 72 y.o. female admitted on 08/25/2017 with intra-abdominal pain.  Pharmacy has been consulted for Cipro dosing. WBC mildly elevated. Renal function good. CT with diverticulitis.   Plan: Cipro 400 mg IV q12h Flagyl per MD Trend WBC, temp, renal function  F/U infectious work-up  Height: 5\' 5"  (165.1 cm) Weight: 220 lb (99.8 kg) IBW/kg (Calculated) : 57  Temp (24hrs), Avg:99.3 F (37.4 C), Min:98.5 F (36.9 C), Max:99.9 F (37.7 C)  Recent Labs  Lab 08/25/17 1623 08/25/17 1652  WBC 13.0*  --   CREATININE 0.99  --   LATICACIDVEN  --  1.55    Estimated Creatinine Clearance: 61 mL/min (by C-G formula based on SCr of 0.99 mg/dL).    Allergies  Allergen Reactions  . Codeine Nausea And Vomiting  . Morphine Nausea And Vomiting    Narda Bonds 08/26/2017 2:36 AM

## 2017-08-26 NOTE — H&P (Signed)
History and Physical    Michelle Coleman JKD:[id number] DOB: Sep 11, 1945 DOA: 08/25/2017  PCP: Jefm Petty, MD  Patient coming from: Home.  Chief Complaint: Abdominal pain.  HPI: Michelle Coleman is a 72 y.o. female with history of hypertension to the ER with complaint of left lower quadrant abdominal pain.  Patient symptoms started yesterday morning.  Denies any vomiting or diarrhea.  Has had normal bowel movement.  Denies any fever chills.  Since pain became more worse patient came to the ER.  ED Course: CT of the abdomen pelvis shows acute diverticulitis without any abscess.  Patient was placed on antibiotics.  Following given Dilaudid patient became more lethargic and nauseous and started vomiting.  Patient admitted for further observation.  On my exam patient is mildly lethargic.  Abdomen appears nontender.  Review of Systems: As per HPI, rest all negative.   Past Medical History:  Diagnosis Date  . Hx of cholecystectomy   . 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 Skin abnormalities 12/9831   lichens sclerosis, kerotosis ( 11/2006)    Past Surgical History:  Procedure Laterality Date  . BUNIONECTOMY  1984  . CHOLECYSTECTOMY  1992  . facial cyst removal  1983  . ROTATOR CUFF REPAIR  Jan 2012  . TONSILLECTOMY AND ADENOIDECTOMY  1950     reports that she has never smoked. She has never used smokeless tobacco. She reports that she does not drink alcohol or use drugs.  Allergies  Allergen Reactions  . Codeine Nausea And Vomiting  . Morphine Nausea And Vomiting    Family History  Problem Relation Age of Onset  . Hypertension Mother   . Heart disease Mother   . Stroke Mother   . Uterine cancer Maternal Grandmother   . Stomach cancer Maternal Grandfather   . Heart disease Maternal Uncle   . Colon cancer Neg Hx     Prior to Admission medications   Medication Sig Start Date End Date Taking? Authorizing Provider  clobetasol ointment (TEMOVATE) 8.25 % Apply 1 application  topically 2 (two) times daily. Patient taking differently: Apply 1 application topically as needed.  01/28/17  Yes Huel Cote, NP  lisinopril (PRINIVIL,ZESTRIL) 40 MG tablet Take 40 mg by mouth daily.   Yes [provider]  omeprazole (PRILOSEC OTC) 20 MG tablet Take 20 mg by mouth daily.     Yes [provider]  spironolactone (ALDACTONE) 25 MG tablet Take 25 mg by mouth daily.   Yes [provider]    Physical Exam: Vitals:   08/25/17 2300 08/25/17 2330 08/26/17 0000 08/26/17 0030  BP: (!) 125/59 (!) 126/59 (!) 118/50 (!) 110/50  Pulse: 77 75 72 70  Resp: 17 15 16 17   Temp:      TempSrc:      SpO2: (!) 88% 92% 91% (!) 89%  Weight:      Height:          Constitutional: Moderately built and nourished. Vitals:   08/25/17 2300 08/25/17 2330 08/26/17 0000 08/26/17 0030  BP: (!) 125/59 (!) 126/59 (!) 118/50 (!) 110/50  Pulse: 77 75 72 70  Resp: 17 15 16 17   Temp:      TempSrc:      SpO2: (!) 88% 92% 91% (!) 89%  Weight:      Height:       Eyes: Anicteric no pallor. ENMT: No discharge from the ears eyes nose or mouth. Neck: No mass felt.  No neck  rigidity. Respiratory: No rhonchi or crepitations. Cardiovascular: S1-S2 heard no murmurs appreciated. Abdomen: Soft nontender bowel sounds present. Musculoskeletal: No edema.  No joint effusion. Skin: No rash.  Skin appears warm. Neurologic: Mildly lethargic but oriented to time place and person.  Moves all extremities. Psychiatric: Mildly lethargic but oriented to time place and person.   Labs on Admission: I have personally reviewed following labs and imaging studies  CBC: Recent Labs  Lab 08/25/17 1623  WBC 13.0*  NEUTROABS 9.9*  HGB 14.8  HCT 44.3  MCV 89.1  PLT 027   Basic Metabolic Panel: Recent Labs  Lab 08/25/17 1623  NA 140  K 4.6  CL 105  CO2 27  GLUCOSE 96  BUN 17  CREATININE 0.99  CALCIUM 9.5   GFR: Estimated Creatinine Clearance: 61 mL/min (by C-G formula based  on SCr of 0.99 mg/dL). Liver Function Tests: Recent Labs  Lab 08/25/17 1623  AST 16  ALT 13*  ALKPHOS 67  BILITOT 0.8  PROT 7.2  ALBUMIN 3.6   Recent Labs  Lab 08/25/17 1623  LIPASE 29   No results for input(s): AMMONIA in the last 168 hours. Coagulation Profile: No results for input(s): INR, PROTIME in the last 168 hours. Cardiac Enzymes: No results for input(s): CKTOTAL, CKMB, CKMBINDEX, TROPONINI in the last 168 hours. BNP (last 3 results) No results for input(s): PROBNP in the last 8760 hours. HbA1C: No results for input(s): HGBA1C in the last 72 hours. CBG: No results for input(s): GLUCAP in the last 168 hours. Lipid Profile: No results for input(s): CHOL, HDL, LDLCALC, TRIG, CHOLHDL, LDLDIRECT in the last 72 hours. Thyroid Function Tests: No results for input(s): TSH, T4TOTAL, FREET4, T3FREE, THYROIDAB in the last 72 hours. Anemia Panel: No results for input(s): VITAMINB12, FOLATE, FERRITIN, TIBC, IRON, RETICCTPCT in the last 72 hours. Urine analysis:    Component Value Date/Time   COLORURINE YELLOW 06/30/2012 0914   APPEARANCEUR CLEAR 06/30/2012 0914   LABSPEC 1.010 06/30/2012 0914   PHURINE 5.5 06/30/2012 0914   GLUCOSEU NEG 06/30/2012 0914   HGBUR LARGE (A) 06/30/2012 0914   BILIRUBINUR NEG 06/30/2012 0914   KETONESUR NEG 06/30/2012 0914   PROTEINUR NEG 06/30/2012 0914   UROBILINOGEN 0.2 06/30/2012 0914   NITRITE NEG 06/30/2012 0914   LEUKOCYTESUR NEG 06/30/2012 0914   Sepsis Labs: @LABRCNTIP (procalcitonin:4,lacticidven:4) )No results found for this or any previous visit (from the past 240 hour(s)).   Radiological Exams on Admission: Ct Abdomen Pelvis W Contrast  Result Date: 08/25/2017 CLINICAL DATA:  Periumbilical pain radiating down to the suprapubic region and lower back. EXAM: CT ABDOMEN AND PELVIS WITH CONTRAST TECHNIQUE: Multidetector CT imaging of the abdomen and pelvis was performed using the standard protocol following bolus administration of  intravenous contrast. CONTRAST:  163mL OMNIPAQUE IOHEXOL 300 MG/ML  SOLN COMPARISON:  11/29/2014, 08/04/2012 FINDINGS: Lower chest: Normal size heart. No pericardial effusion. Bibasilar dependent atelectasis. No acute abnormality. Hepatobiliary: Redemonstration of hepatic steatosis and right hepatic vascular malformation. No enhancing mass lesions. Status post cholecystectomy. No ductal dilatation. Pancreas: Negative Spleen: Stable 11 mm hypodensity in the spleen consistent with a benign finding given stability since 2014. No splenomegaly. Adrenals/Urinary Tract: Normal right adrenal gland. Stable nodularity of the left adrenal gland unchanged. Parapelvic bilateral renal cysts without nephrolithiasis nor obstructive uropathy. No enhancing mass lesions of either kidney. The urinary bladder is physiologically distended without focal mural thickening or calculus. Stomach/Bowel: Acute diverticulitis with pericolonic inflammation along the distal descending and proximal sigmoid colon. Diverticulosis and circular  muscle hypertrophy further distally in the mid to distal sigmoid. No bowel obstruction, abscess nor free air. The stomach and small intestine are nonacute. Vascular/Lymphatic: Mild aortoiliac atherosclerosis. Patent branch vessels. No aneurysm or dissection. Reproductive: Uterus and bilateral adnexa are unremarkable. Other: No abdominal wall hernia or abnormality. No abdominopelvic ascites. Musculoskeletal: No aggressive osseous lesions nor fracture. Degenerative disc disease T10-11 at L5-S1. IMPRESSION: 1. Acute uncomplicated diverticulitis at the junction of the descending and sigmoid colon. 2. 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 3. Bilateral parapelvic renal cysts.  No obstructive uropathy. 4. 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 Electronically Signed   By: Ashley Royalty M.D.   On: 08/25/2017 19:26     Assessment/Plan Principal Problem:   Acute diverticulitis Active Problems:   Essential hypertension    1. Acute sigmoid diverticulitis -patient placed on liquid diet and if tolerated slowly advance.  Patient on Cipro and Flagyl.  Patient has had previous episode of diverticulitis with abscess requiring drainage and has had follow-up colonoscopy in 2010 by Dr. Olevia Perches. 2. Hypertension -for now we will keep patient on PRN IV hydralazine.  Patient became lethargic following Dilaudid and also had vomiting.  Will avoid narcotics and I have placed patient on Toradol for pain.   DVT prophylaxis: Lovenox. Code Status: Full code. Family Communication: No family at the bedside. Disposition Plan: Home. Consults called: None. Admission status: Observation.   Rise Patience MD [facility] Pager [phone].  If 7PM-7AM, please contact night-coverage [email or web address] Password [email or web address]  08/26/2017, 1:59 AM

## 2017-08-27 DIAGNOSIS — K5792 Diverticulitis of intestine, part unspecified, without perforation or abscess without bleeding: Secondary | ICD-10-CM | POA: Diagnosis not present

## 2017-08-27 DIAGNOSIS — K5732 Diverticulitis of large intestine without perforation or abscess without bleeding: Secondary | ICD-10-CM | POA: Diagnosis not present

## 2017-08-27 LAB — GLUCOSE, CAPILLARY
GLUCOSE-CAPILLARY: 111 mg/dL — AB (ref 65–99)
Glucose-Capillary: 96 mg/dL (ref 65–99)

## 2017-08-27 MED ORDER — ACETAMINOPHEN 650 MG RE SUPP: 650.0000 mg | Freq: Four times a day (QID) | RECTAL | 0 refills | Status: DC | PRN

## 2017-08-27 MED ORDER — CIPROFLOXACIN HCL 500 MG PO TABS: 500.0000 mg | ORAL_TABLET | Freq: Two times a day (BID) | ORAL | 0 refills | Status: AC

## 2017-08-27 MED ORDER — METRONIDAZOLE 500 MG PO TABS: 500.0000 mg | ORAL_TABLET | Freq: Three times a day (TID) | ORAL | 0 refills | Status: AC

## 2017-08-27 NOTE — Discharge Summary (Signed)
PATIENT DETAILS Name: Michelle Coleman Age: 72 y.o. Sex: female Date of Birth: 06/25/1945 MRN: [id number]. Admitting Physician: Rise Patience, MD [id number], Legrand Como, MD  Admit Date: 08/25/2017 Discharge date: 08/27/2017  Recommendations for Outpatient Follow-up:  1. Follow up with PCP in 1-2 weeks 2. Please obtain BMP/CBC in one week 3    Please ensure follow-up with gastroenterology-to make sure patient is up-to-date with colonoscopy screening.  Admitted From:  Home  Disposition: [place]: No  Equipment/Devices: None  Discharge Condition: Stable  CODE STATUS: FULL CODE  Diet recommendation:  Stay on a full liquid/soft diet for 1 week.  Brief Summary: See H&P, Labs, Consult and Test reports for all details in brief,Patient is a 72 y.o. female with prior history of diverticulitis, hypertension presented to the ED with mostly left lower quadrant abdominal pain.  She was found to have acute diverticulitis and subsequently admitted to the hospitalist service.  Brief Hospital Course: Acute diverticulitis:  Patient was admitted and started on clear liquid diet-she was also started on ciprofloxacin and Flagyl.  She was actually given narcotics in the emergency room following which she became drowsy and lethargic-and was asked to be admitted for overnight observation.  Per patient-she is very sensitive to narcotics and does not wish to take them as much as possible.  Diet was slowly advanced-she has tolerated advancement in diet without any major issues, she still has some mild left lower quadrant tenderness-but she is able to manage with Tylenol for now.  She slept well overnight.  Since she is responding to antimicrobial therapy-and pain seems to be controlled with nonnarcotic means-I suspect she is stable to be discharged home with close outpatient follow-up with PCP.  Continue levofloxacin and Flagyl for 8 more days to complete a 10-day course.  PCP to make sure  patient is up-to-date with colonoscopy screening.  Have asked the patient that if her pain worsens after she is discharged and is not controlled with Toradol/Motrin-to call her PCP to see if she can be prescribed tramadol.  Hypertension: Blood pressure appears to be currently controlled-it is in all antihypertensives on discharge.   Procedures/Studies: None  Discharge Diagnoses:  Principal Problem:   Acute diverticulitis Active Problems:   Essential hypertension   Discharge Instructions:  Activity:  As tolerated    Discharge Instructions    Call MD for:  persistant nausea and vomiting   Complete by:  As directed    Call MD for:  severe uncontrolled pain   Complete by:  As directed    Diet - low sodium heart healthy   Complete by:  As directed    Stay on a soft/full liquid diet for at least 1 week   Discharge instructions   Complete by:  As directed    Follow with Primary MD  Jefm Petty, MD in 1 week  Stay on a full liquid/soft diet for at least 1 more week-take Tylenol as needed for Motrin as needed for pain.  If you continue to have severe pain in spite of taking Tylenol/Motrin-please call your primary care practitioner to see if he can prescribe you tramadol.  Please get a complete blood count and chemistry panel checked by your Primary MD at your next visit, and again as instructed by your Primary MD.  Get Medicines reviewed and adjusted: Please take all your medications with you for your next visit with your Primary MD  Laboratory/radiological data: Please request your Primary MD to go over all hospital  tests and procedure/radiological results at the follow up, please ask your Primary MD to get all Hospital records sent to his/her office.  In some cases, they will be blood work, cultures and biopsy results pending at the time of your discharge. Please request that your primary care M.D. follows up on these results.  Also Note the following: If you experience  worsening of your admission symptoms, develop shortness of breath, life threatening emergency, suicidal or homicidal thoughts you must seek medical attention immediately by calling 911 or calling your MD immediately  if symptoms less severe.  You must read complete instructions/literature along with all the possible adverse reactions/side effects for all the Medicines you take and that have been prescribed to you. Take any new Medicines after you have completely understood and accpet all the possible adverse reactions/side effects.   Do not drive when taking Pain medications or sleeping medications (Benzodaizepines)  Do not take more than prescribed Pain, Sleep and Anxiety Medications. It is not advisable to combine anxiety,sleep and pain medications without talking with your primary care practitioner  Special Instructions: If you have smoked or chewed Tobacco  in the last 2 yrs please stop smoking, stop any regular Alcohol  and or any Recreational drug use.  Wear Seat belts while driving.  Please note: You were cared for by a hospitalist during your hospital stay. Once you are discharged, your primary care physician will handle any further medical issues. Please note that NO REFILLS for any discharge medications will be authorized once you are discharged, as it is imperative that you return to your primary care physician (or establish a relationship with a primary care physician if you do not have one) for your post hospital discharge needs so that they can reassess your need for medications and monitor your lab values.   Increase activity slowly   Complete by:  As directed      Allergies as of 08/27/2017      Reactions   Codeine Nausea And Vomiting   Morphine Nausea And Vomiting      Medication List    TAKE these medications   acetaminophen 650 MG suppository Commonly known as:  TYLENOL Place 1 suppository (650 mg total) rectally every 6 (six) hours as needed for mild pain (or Fever >/=  101).   ciprofloxacin 500 MG tablet Commonly known as:  CIPRO Take 1 tablet (500 mg total) by mouth 2 (two) times daily for 8 days.   clobetasol ointment 0.05 % Commonly known as:  TEMOVATE Apply 1 application topically 2 (two) times daily. What changed:    when to take this  reasons to take this   lisinopril 40 MG tablet Commonly known as:  PRINIVIL,ZESTRIL Take 40 mg by mouth daily.   metroNIDAZOLE 500 MG tablet Commonly known as:  FLAGYL Take 1 tablet (500 mg total) by mouth 3 (three) times daily for 8 days.   omeprazole 20 MG tablet Commonly known as:  PRILOSEC OTC Take 20 mg by mouth daily.   spironolactone 25 MG tablet Commonly known as:  ALDACTONE Take 25 mg by mouth daily.      Follow-up Information    Jefm Petty, MD. Schedule an appointment as soon as possible for a visit in 1 week(s).   Specialty:  Family Medicine Contact information: [address] [place]  [address] [phone]          Allergies  Allergen Reactions  . Codeine Nausea And Vomiting  . Morphine Nausea And  Vomiting    Consultations:   None  Other Procedures/Studies: Ct Abdomen Pelvis W Contrast  Result Date: 08/25/2017 CLINICAL DATA:  Periumbilical pain radiating down to the suprapubic region and lower back. EXAM: CT ABDOMEN AND PELVIS WITH CONTRAST TECHNIQUE: Multidetector CT imaging of the abdomen and pelvis was performed using the standard protocol following bolus administration of intravenous contrast. CONTRAST:  168mL OMNIPAQUE IOHEXOL 300 MG/ML  SOLN COMPARISON:  11/29/2014, 08/04/2012 FINDINGS: Lower chest: Normal size heart. No pericardial effusion. Bibasilar dependent atelectasis. No acute abnormality. Hepatobiliary: Redemonstration of hepatic steatosis and right hepatic vascular malformation. No enhancing mass lesions. Status post cholecystectomy. No ductal dilatation. Pancreas: Negative Spleen: Stable 11 mm hypodensity in the spleen consistent with a  benign finding given stability since 2014. No splenomegaly. Adrenals/Urinary Tract: Normal right adrenal gland. Stable nodularity of the left adrenal gland unchanged. Parapelvic bilateral renal cysts without nephrolithiasis nor obstructive uropathy. No enhancing mass lesions of either kidney. The urinary bladder is physiologically distended without focal mural thickening or calculus. Stomach/Bowel: Acute diverticulitis with pericolonic inflammation along the distal descending and proximal sigmoid colon. Diverticulosis and circular muscle hypertrophy further distally in the mid to distal sigmoid. No bowel obstruction, abscess nor free air. The stomach and small intestine are nonacute. Vascular/Lymphatic: Mild aortoiliac atherosclerosis. Patent branch vessels. No aneurysm or dissection. Reproductive: Uterus and bilateral adnexa are unremarkable. Other: No abdominal wall hernia or abnormality. No abdominopelvic ascites. Musculoskeletal: No aggressive osseous lesions nor fracture. Degenerative disc disease T10-11 at L5-S1. IMPRESSION: 1. Acute uncomplicated diverticulitis at the junction of the descending and sigmoid colon. 2. 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 3. Bilateral parapelvic renal cysts.  No obstructive uropathy. 4. 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 Electronically Signed   By: Ashley Royalty M.D.   On: 08/25/2017 19:26     TODAY-DAY OF DISCHARGE:  Subjective:   Gilles Chiquito today has no headache,no chest abdominal pain,no new weakness tingling or numbness, feels much better wants to go home today.   Objective:   Blood pressure (!) 113/50, pulse 67, temperature 98.2 F (36.8 C), temperature source Oral, resp. rate 18, height 5\' 5"  (1.651 m), weight 111.2 kg (245 lb 2.4 oz), last menstrual period  02/13/1996, SpO2 96 %.  Intake/Output Summary (Last 24 hours) at 08/27/2017 0821 Last data filed at 08/27/2017 0600 Gross per 24 hour  Intake 520 ml  Output -  Net 520 ml   Filed Weights   08/25/17 1624 08/26/17 2100  Weight: 99.8 kg (220 lb) 111.2 kg (245 lb 2.4 oz)    Exam: Awake Alert, Oriented *3, No new F.N deficits, Normal affect [place].AT,PERRAL Supple Neck,No JVD, No cervical lymphadenopathy appriciated.  Symmetrical Chest wall movement, Good air movement bilaterally, CTAB RRR,No Gallops,Rubs or new Murmurs, No Parasternal Heave +ve B.Sounds, Abd Soft, Non tender, No organomegaly appriciated, No rebound -guarding or rigidity. No Cyanosis, Clubbing or edema, No new Rash or bruise   PERTINENT RADIOLOGIC STUDIES: Ct Abdomen Pelvis W Contrast  Result Date: 08/25/2017 CLINICAL DATA:  Periumbilical pain radiating down to the suprapubic region and lower back. EXAM: CT ABDOMEN AND PELVIS WITH CONTRAST TECHNIQUE: Multidetector CT imaging of the abdomen and pelvis was performed using the standard protocol following bolus administration of intravenous contrast. CONTRAST:  19mL OMNIPAQUE IOHEXOL 300 MG/ML  SOLN COMPARISON:  11/29/2014, 08/04/2012 FINDINGS: Lower chest: Normal size heart. No pericardial effusion.  Bibasilar dependent atelectasis. No acute abnormality. Hepatobiliary: Redemonstration of hepatic steatosis and right hepatic vascular malformation. No enhancing mass lesions. Status post cholecystectomy. No ductal dilatation. Pancreas: Negative Spleen: Stable 11 mm hypodensity in the spleen consistent with a benign finding given stability since 2014. No splenomegaly. Adrenals/Urinary Tract: Normal right adrenal gland. Stable nodularity of the left adrenal gland unchanged. Parapelvic bilateral renal cysts without nephrolithiasis nor obstructive uropathy. No enhancing mass lesions of either kidney. The urinary bladder is physiologically distended without focal mural thickening or calculus.  Stomach/Bowel: Acute diverticulitis with pericolonic inflammation along the distal descending and proximal sigmoid colon. Diverticulosis and circular muscle hypertrophy further distally in the mid to distal sigmoid. No bowel obstruction, abscess nor free air. The stomach and small intestine are nonacute. Vascular/Lymphatic: Mild aortoiliac atherosclerosis. Patent branch vessels. No aneurysm or dissection. Reproductive: Uterus and bilateral adnexa are unremarkable. Other: No abdominal wall hernia or abnormality. No abdominopelvic ascites. Musculoskeletal: No aggressive osseous lesions nor fracture. Degenerative disc disease T10-11 at L5-S1. IMPRESSION: 1. Acute uncomplicated diverticulitis at the junction of the descending and sigmoid colon. 2. 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 3. Bilateral parapelvic renal cysts.  No obstructive uropathy. 4. 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 Electronically Signed   By: Ashley Royalty M.D.   On: 08/25/2017 19:26     PERTINENT LAB RESULTS: CBC: Recent Labs    08/25/17 1623 08/26/17 0230  WBC 13.0* 13.5*  HGB 14.8 13.4  HCT 44.3 40.1  PLT 212 177   CMET CMP     Component Value Date/Time   NA 136 08/26/2017 0230   K 4.1 08/26/2017 0230   CL 104 08/26/2017 0230   CO2 25 08/26/2017 0230   GLUCOSE 140 (H) 08/26/2017 0230   BUN 17 08/26/2017 0230   CREATININE 0.89 08/26/2017 0230   CALCIUM 8.7 (L) 08/26/2017 0230   PROT 6.1 (L) 08/26/2017 0230   ALBUMIN 3.0 (L) 08/26/2017 0230   AST 16 08/26/2017 0230   ALT 14 08/26/2017 0230   ALKPHOS 58 08/26/2017 0230   BILITOT 1.1 08/26/2017 0230   GFRNONAA >60 08/26/2017 0230   GFRAA >60 08/26/2017 0230    GFR Estimated Creatinine Clearance: 72 mL/min (by C-G formula based on SCr of 0.89 mg/dL). Recent Labs     08/25/17 1623  LIPASE 29   No results for input(s): CKTOTAL, CKMB, CKMBINDEX, TROPONINI in the last 72 hours. Invalid input(s): POCBNP No results for input(s): DDIMER in the last 72 hours. No results for input(s): HGBA1C in the last 72 hours. No results for input(s): CHOL, HDL, LDLCALC, TRIG, CHOLHDL, LDLDIRECT in the last 72 hours. No results for input(s): TSH, T4TOTAL, T3FREE, THYROIDAB in the last 72 hours.  Invalid input(s): FREET3 No results for input(s): VITAMINB12, FOLATE, FERRITIN, TIBC, IRON, RETICCTPCT in the last 72 hours. Coags: No results for input(s): INR in the last 72 hours.  Invalid input(s): PT Microbiology: No results found for this or any previous visit (from the past 240 hour(s)).  FURTHER DISCHARGE INSTRUCTIONS:  Get Medicines reviewed and adjusted: Please take all your medications with you for your next visit with your Primary MD  Laboratory/radiological data: Please request your Primary MD to go over all hospital tests and procedure/radiological results at the follow up, please ask your Primary MD to get all Hospital records sent to his/her office.  In some cases, they  will be blood work, cultures and biopsy results pending at the time of your discharge. Please request that your primary care M.D. goes through all the records of your hospital data and follows up on these results.  Also Note the following: If you experience worsening of your admission symptoms, develop shortness of breath, life threatening emergency, suicidal or homicidal thoughts you must seek medical attention immediately by calling 911 or calling your MD immediately  if symptoms less severe.  You must read complete instructions/literature along with all the possible adverse reactions/side effects for all the Medicines you take and that have been prescribed to you. Take any new Medicines after you have completely understood and accpet all the possible adverse reactions/side effects.   Do not  drive when taking Pain medications or sleeping medications (Benzodaizepines)  Do not take more than prescribed Pain, Sleep and Anxiety Medications. It is not advisable to combine anxiety,sleep and pain medications without talking with your primary care practitioner  Special Instructions: If you have smoked or chewed Tobacco  in the last 2 yrs please stop smoking, stop any regular Alcohol  and or any Recreational drug use.  Wear Seat belts while driving.  Please note: You were cared for by a hospitalist during your hospital stay. Once you are discharged, your primary care physician will handle any further medical issues. Please note that NO REFILLS for any discharge medications will be authorized once you are discharged, as it is imperative that you return to your primary care physician (or establish a relationship with a primary care physician if you do not have one) for your post hospital discharge needs so that they can reassess your need for medications and monitor your lab values.  Total Time spent coordinating discharge including counseling, education and face to face time equals 35 minutes.  Signed: Dozier Berkovich 08/27/2017 8:21 AM

## 2017-08-27 NOTE — Progress Notes (Signed)
PT received Cipro. Upon completion of antibiotic patient C/O itching around IV sight  near completion of medication. No flushing, redness around sight, change in VS or other signs of allergic reaction. Flushed her IV and ran flagyl with no complaints. Will continue to monitor

## 2017-09-17 ENCOUNTER — Encounter: Payer: Self-pay | Admitting: Gastroenterology

## 2017-09-17 ENCOUNTER — Ambulatory Visit (INDEPENDENT_AMBULATORY_CARE_PROVIDER_SITE_OTHER): Payer: Medicare Other | Admitting: Gastroenterology

## 2017-09-17 VITALS — BP 138/80 | HR 74 | Ht 65.0 in | Wt 220.0 lb

## 2017-09-17 DIAGNOSIS — R1010 Upper abdominal pain, unspecified: Secondary | ICD-10-CM

## 2017-09-17 DIAGNOSIS — R110 Nausea: Secondary | ICD-10-CM | POA: Diagnosis not present

## 2017-09-17 DIAGNOSIS — R194 Change in bowel habit: Secondary | ICD-10-CM | POA: Diagnosis not present

## 2017-09-17 DIAGNOSIS — R634 Abnormal weight loss: Secondary | ICD-10-CM | POA: Diagnosis not present

## 2017-09-17 DIAGNOSIS — K5732 Diverticulitis of large intestine without perforation or abscess without bleeding: Secondary | ICD-10-CM | POA: Diagnosis not present

## 2017-09-17 NOTE — Progress Notes (Signed)
[facility] Consult Note:  History: Michelle Coleman 09/17/2017  Michelle Coleman was referred to [place] after hospital discharge  Reason for consult/chief complaint: Diverticulitis (still having abdominal pain , on the "verge" of nausea, no appetite, no fever, s/p hospital stay in May)   Subjective  HPI:  This is a very pleasant 72 year old woman previously seen by Dr.Brodie for diverticulitis.  Her last note in August 2016 notes a history of diverticulitis with phlegmon in 2010.  At the 2016 office visit, the patient was complaining of lower abdominal pain that was of somewhat different character than her previous diverticulitis.  Dr. Maurene Capes prescribed empiric Augmentin and obtain a CT scan abdomen and pelvis the same day.  That report indicates no inflammation or apparent diverticulitis, and there was a" moderate stool burden".  [name] was admitted to the hospital from May 7 to May 9 for left-sided diverticulitis without phlegmon or perforation seen on CT scan.  The discharge summary notes that she received opioid analgesics in the ED, which made her quite lethargic and she is reportedly sensitive to that medicine.  She recovered well and was discharged on levofloxacin and metronidazole for a total 10-day course.  Follow-up was arranged with this clinic to make sure she is well and also up-to-date on colon cancer screening.  Her last colonoscopy with Dr. Olevia Perches was in June 2010.  The preparation with MiraLAX was reportedly good, there was left-sided diverticulosis, and no polyps were discovered.  [name] reports that she has still felt unwell since the recent hospital discharge.  She continues to have upper abdominal pain that is mostly midline, and comes in spasms.  The stool is still semi-formed a few times a day, no rectal bleeding.  Her appetite is fair, and she leaves she has lost 5 or 6 pounds in the last month or so.  She has intermittent nausea, and the metronidazole did not seem to agree  with her stomach.  She denies vomiting or dysphagia.  Because of the location of pain she expressed some concern about a possible stomach ulcer.  She also reports being under significant stress since her husband's suicide in March of this year.  They had been married 17 years.  Pluma reports resolution of the abdominal pain she had when last seen by Dr. Maurene Capes in August 2016.  She seems to recall got better fairly quickly, supposed that she probably finished the antibiotics that were given to her.  And she felt well until becoming acutely sick a couple of days prior to the recent admission.  So she was not having any chronic abdominal pain or change in bowel habits in the weeks or months leading up to that.  ROS:  Review of Systems  Constitutional: Positive for appetite change and unexpected weight change.  HENT: Negative for mouth sores and voice change.   Eyes: Negative for pain and redness.  Respiratory: Negative for cough and shortness of breath.   Cardiovascular: Negative for chest pain and palpitations.  Genitourinary: Negative for dysuria and hematuria.  Musculoskeletal: Negative for arthralgias and myalgias.  Skin: Negative for pallor and rash.  Neurological: Negative for weakness and headaches.  Hematological: Negative for adenopathy.  Psychiatric/Behavioral:       She is grieving over the loss of her husband     Past Medical History: Past Medical History:  Diagnosis Date  . Arthritis    "lower back" (08/26/2017)  . CKD (chronic kidney disease), stage III ([place])   . Diverticulitis   .  GERD (gastroesophageal reflux disease)   . History of hiatal hernia   . Hx of cholecystectomy   . Hypertension   . Migraine ~ 2004 X 1  . PONV (postoperative nausea and vomiting)   . 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 Skin abnormalities 60/1093   lichens sclerosis, kerotosis ( 11/2006)     Past Surgical History: Past Surgical History:  Procedure Laterality Date  . BUNIONECTOMY Right 1984    . CYSTECTOMY Right 1983   facial cyst  . HEMORROIDECTOMY     "lanced them"  . LAPAROSCOPIC CHOLECYSTECTOMY  1992  . SHOULDER ARTHROSCOPY W/ ROTATOR CUFF REPAIR Right 04/2010  . TONSILLECTOMY AND ADENOIDECTOMY  1950     Family History: Family History  Problem Relation Age of Onset  . Hypertension Mother   . Heart disease Mother   . Stroke Mother   . Uterine cancer Maternal Grandmother   . Stomach cancer Maternal Grandfather   . Heart disease Maternal Uncle   . Other Father        Father was un-known to patient  . Colon cancer Neg Hx     Social History: Social History   Socioeconomic History  . Marital status: Widowed    Spouse name: Not on file  . Number of children: 2  . Years of education: Not on file  . Highest education level: Not on file  Occupational History  . Occupation: CPA-retired  Social Needs  . Financial resource strain: Not on file  . Food insecurity:    Worry: Not on file    Inability: Not on file  . Transportation needs:    Medical: Not on file    Non-medical: Not on file  Tobacco Use  . Smoking status: Never Smoker  . Smokeless tobacco: Never Used  Substance and Sexual Activity  . Alcohol use: No  . Drug use: No  . Sexual activity: Not Currently    Comment: intercourse age 64, less than 5 sexual parters, des neg  Lifestyle  . Physical activity:    Days per week: Not on file    Minutes per session: Not on file  . Stress: Not on file  Relationships  . Social connections:    Talks on phone: Not on file    Gets together: Not on file    Attends religious service: Not on file    Active member of club or organization: Not on file    Attends meetings of clubs or organizations: Not on file    Relationship status: Not on file  Other Topics Concern  . Not on file  Social History Narrative   Works as Engineer, maintenance (IT).  Denies cigarettes, etOH, other drugs     Allergies: Allergies  Allergen Reactions  . Codeine Nausea And Vomiting  . Morphine Nausea  And Vomiting    Outpatient Meds: Current Outpatient Medications  Medication Sig Dispense Refill  . acetaminophen (TYLENOL) 650 MG suppository Place 1 suppository (650 mg total) rectally every 6 (six) hours as needed for mild pain (or Fever >/= 101). 12 suppository 0  . clobetasol ointment (TEMOVATE) 2.35 % Apply 1 application topically 2 (two) times daily. (Patient taking differently: Apply 1 application topically as needed. ) 30 g 12  . lisinopril (PRINIVIL,ZESTRIL) 40 MG tablet Take 40 mg by mouth daily.    Marland Kitchen omeprazole (PRILOSEC OTC) 20 MG tablet Take 20 mg by mouth daily.      Marland Kitchen spironolactone (ALDACTONE) 25 MG tablet Take 25 mg by  mouth daily.     No current facility-administered medications for this visit.       ___________________________________________________________________ Objective   Exam:  BP 138/80   Pulse 74   Ht 5\' 5"  (1.651 m)   Wt 220 lb (99.8 kg)   LMP 02/13/1996   BMI 36.61 kg/m    General: this is a(n) well-appearing woman.  She had a pain episode during the office visit, pressing her hand to the upper abdomen.  She was visibly upset when discussing her husband.  No muscle wasting.  Eyes: sclera anicteric, no redness  ENT: oral mucosa moist without lesions, no cervical or supraclavicular lymphadenopathy, good dentition  CV: RRR without murmur, S1/S2, no JVD, no peripheral edema  Resp: clear to auscultation bilaterally, normal RR and effort noted  GI: soft, moderate epigastric tenderness, with active bowel sounds. No guarding or palpable organomegaly noted.  Skin; warm and dry, no rash or jaundice noted  Neuro: awake, alert and oriented x 3. Normal gross motor function and fluent speech  Labs:  CBC Latest Ref Rng & Units 08/26/2017 08/25/2017 11/29/2014  WBC 4.0 - 10.5 K/uL 13.5(H) 13.0(H) 8.1  Hemoglobin 12.0 - 15.0 g/dL 13.4 14.8 13.4  Hematocrit 36.0 - 46.0 % 40.1 44.3 39.0  Platelets 150 - 400 K/uL 177 212 230.0   CMP Latest Ref Rng & Units  08/26/2017 08/25/2017 11/29/2014  Glucose 65 - 99 mg/dL 140(H) 96 97  BUN 6 - 20 mg/dL 17 17 24(H)  Creatinine 0.44 - 1.00 mg/dL 0.89 0.99 0.94  Sodium 135 - 145 mmol/L 136 140 141  Potassium 3.5 - 5.1 mmol/L 4.1 4.6 3.4(L)  Chloride 101 - 111 mmol/L 104 105 103  CO2 22 - 32 mmol/L 25 27 32  Calcium 8.9 - 10.3 mg/dL 8.7(L) 9.5 9.8  Total Protein 6.5 - 8.1 g/dL 6.1(L) 7.2 -  Total Bilirubin 0.3 - 1.2 mg/dL 1.1 0.8 -  Alkaline Phos 38 - 126 U/L 58 67 -  AST 15 - 41 U/L 16 16 -  ALT 14 - 54 U/L 14 13(L) -     Radiologic Studies:  CTAP report of 08/25/17 on file and reviewed. I also personally reviewed the CT images.   Assessment: Encounter Diagnoses  Name Primary?  Marland Kitchen Upper abdominal pain Yes  . Nausea without vomiting   . Diverticulitis of colon   . Abnormal loss of weight   . Change in bowel habits     I am concerned she may still have smoldering diverticulitis, even though she has pain and tenderness in the epigastrium.  Plan:  CT of the abdomen and pelvis with oral and IV contrast If still shows diverticulitis, we will give her a repeat course of antibiotics, and most  likely use Augmentin since she does not have allergies to antibiotics and she would seem to have failed the previous antibiotic course. If she does not improve with that, or if imaging shows resolution of the diverticulitis, she will most likely need colonoscopy and perhaps upper endoscopy.  We discussed how the clinical course and imaging was most consistent with diverticulitis.  However, inflammatory bowel disease or less likely neoplasia could have a similar appearance.  I think those are less likely in this scenario since she did not have chronic symptoms leading up to the hospital stay.  Total time 40 minutes, over half spent in face-to-face contact with patient.  Extensive chart review required.  Nelida Meuse III  CC: Jefm Petty, MD

## 2017-09-17 NOTE — Patient Instructions (Signed)
If you are age 72 or older, your body mass index should be between 23-30. Your Body mass index is 36.61 kg/m. If this is out of the aforementioned range listed, please consider follow up with your Primary Care Provider.  If you are age 21 or younger, your body mass index should be between 19-25. Your Body mass index is 36.61 kg/m. If this is out of the aformentioned range listed, please consider follow up with your Primary Care Provider.   You have been scheduled for a CT scan of the abdomen and pelvis at [facility] ([address]---this is in the same building as Press [facility]).   You are scheduled on 09-18-2017 at [place] should arrive 15 minutes prior to your appointment time for registration. Please follow the written instructions below on the day of your exam:  WARNING: IF YOU ARE ALLERGIC TO IODINE/X-RAY DYE, PLEASE NOTIFY RADIOLOGY IMMEDIATELY AT [phone]! YOU WILL BE GIVEN A 13 HOUR PREMEDICATION PREP.  1) Do not eat or drink anything after 11am (4 hours prior to your test) 2) You have been given 2 bottles of oral contrast to drink. The solution may taste better if refrigerated, but do NOT add ice or any other liquid to this solution. Shake well before drinking.    Drink 1 bottle of contrast @ 1pm (2 hours prior to your exam)  Drink 1 bottle of contrast @ 2pm (1 hour prior to your exam)  You may take any medications as prescribed with a small amount of water except for the following: Metformin, Glucophage, Glucovance, Avandamet, Riomet, Fortamet, Actoplus Met, Janumet, Glumetza or Metaglip. The above medications must be held the day of the exam AND 48 hours after the exam.  The purpose of you drinking the oral contrast is to aid in the visualization of your intestinal tract. The contrast solution may cause some diarrhea. Before your exam is started, you will be given a small amount of fluid to drink. Depending on your individual set of symptoms, you may also  receive an intravenous injection of x-ray contrast/dye. Plan on being at [facility] for 30 minutes or longer, depending on the type of exam you are having performed.  This test typically takes 30-45 minutes to complete.  If you have any questions regarding your exam or if you need to reschedule, you may call the CT department at [phone] between the hours of 8:00 am and 5:00 pm, Monday-Friday.  ________________________________________________________________________  It was a pleasure to meet you today!  Dr. Loletha Carrow

## 2017-09-18 ENCOUNTER — Ambulatory Visit (INDEPENDENT_AMBULATORY_CARE_PROVIDER_SITE_OTHER)
Admission: RE | Admit: 2017-09-18 | Discharge: 2017-09-18 | Disposition: A | Discharge status: Home / Self Care | Payer: Medicare Other | Source: Ambulatory Visit | Attending: Gastroenterology | Admitting: Gastroenterology

## 2017-09-18 DIAGNOSIS — R634 Abnormal weight loss: Secondary | ICD-10-CM

## 2017-09-18 DIAGNOSIS — K5732 Diverticulitis of large intestine without perforation or abscess without bleeding: Secondary | ICD-10-CM | POA: Diagnosis not present

## 2017-09-18 DIAGNOSIS — R110 Nausea: Secondary | ICD-10-CM

## 2017-09-18 DIAGNOSIS — R1010 Upper abdominal pain, unspecified: Secondary | ICD-10-CM | POA: Diagnosis not present

## 2017-09-18 DIAGNOSIS — R194 Change in bowel habit: Secondary | ICD-10-CM

## 2017-09-18 MED ORDER — IOPAMIDOL (ISOVUE-300) INJECTION 61%
100.0000 mL | Freq: Once | INTRAVENOUS | Status: AC | PRN
  Administered 2017-09-18: 100 mL via INTRAVENOUS

## 2017-09-21 ENCOUNTER — Other Ambulatory Visit: Payer: Self-pay

## 2017-09-21 MED ORDER — AMOXICILLIN-POT CLAVULANATE 875-125 MG PO TABS: 1.0000 | ORAL_TABLET | Freq: Two times a day (BID) | ORAL | 0 refills | Status: DC

## 2017-09-21 MED ORDER — ONDANSETRON HCL 4 MG PO TABS: 4.0000 mg | ORAL_TABLET | Freq: Three times a day (TID) | ORAL | 1 refills | Status: DC | PRN

## 2017-10-09 ENCOUNTER — Ambulatory Visit (INDEPENDENT_AMBULATORY_CARE_PROVIDER_SITE_OTHER): Payer: Medicare Other | Admitting: Gastroenterology

## 2017-10-09 ENCOUNTER — Encounter: Payer: Self-pay | Admitting: Gastroenterology

## 2017-10-09 VITALS — BP 140/80 | HR 68 | Ht 65.0 in | Wt 222.5 lb

## 2017-10-09 DIAGNOSIS — K5792 Diverticulitis of intestine, part unspecified, without perforation or abscess without bleeding: Secondary | ICD-10-CM | POA: Diagnosis not present

## 2017-10-09 DIAGNOSIS — R140 Abdominal distension (gaseous): Secondary | ICD-10-CM | POA: Diagnosis not present

## 2017-10-09 DIAGNOSIS — R1012 Left upper quadrant pain: Secondary | ICD-10-CM

## 2017-10-09 NOTE — Progress Notes (Signed)
     [facility] Progress Note  Chief Complaint: Sigmoid diverticulitis  Subjective  History:  Michelle Coleman follows up after her recent visit with me.  She had just been hospitalized for severe diverticulitis with phlegmon seen on CT scan.  She was still having left upper quadrant and epigastric pain after finishing antibiotic therapy.  A repeat CT scan showed mild persistent stranding in the proximal sigmoid, so I treated her with a course of Augmentin.  She took a probiotic along with that and tolerated therapy well, with resolution of abdominal pain.  She has some bloating and gas that she feels may be diet related, but otherwise is feeling considerably better.  She denies diarrhea or rectal bleeding.  ROS: Cardiovascular:  no chest pain Respiratory: no dyspnea  The patient's Past Medical, Family and Social History were reviewed and are on file in the EMR.  Objective:  Med list reviewed  Current Outpatient Medications:  .  clobetasol ointment (TEMOVATE) 0.16 %, Apply 1 application topically 2 (two) times daily. (Patient taking differently: Apply 1 application topically as needed. ), Disp: 30 g, Rfl: 12 .  lisinopril (PRINIVIL,ZESTRIL) 40 MG tablet, Take 40 mg by mouth daily., Disp: , Rfl:  .  omeprazole (PRILOSEC OTC) 20 MG tablet, Take 20 mg by mouth daily.  , Disp: , Rfl:  .  spironolactone (ALDACTONE) 25 MG tablet, Take 25 mg by mouth daily., Disp: , Rfl:    Vital signs in last 24 hrs: Vitals:   10/09/17 1622  BP: 140/80  Pulse: 68    Physical Exam  Well-appearing, much better than visit 3 weeks ago when she was visibly uncomfortable.  HEENT: sclera anicteric, oral mucosa moist without lesions  Neck: supple, no thyromegaly, JVD or lymphadenopathy  Cardiac: RRR without murmurs, S1S2 heard, no peripheral edema  Pulm: clear to auscultation bilaterally, normal RR and effort noted  Abdomen: soft, no tenderness, with active bowel sounds. No guarding or palpable  hepatosplenomegaly.  Skin; warm and dry, no jaundice or rash   Radiologic studies:  CTAP 09/18/17:  Subtle stranding prox sigmoid - gave augmentin  @ASSESSMENTPLANBEGIN @ Assessment: Encounter Diagnoses  Name Primary?  . Acute diverticulitis Yes  . LUQ pain   . Abdominal bloating    She had a fairly severe case of sigmoid diverticulitis that required additional antibiotics but is now resolved. She is due for a screening colonoscopy in June 2020 and will be contacted at that time.  I have given her my card and encouraged her to call me as the need arises for recurrence of abdominal pain, rectal bleeding, or any other concerns.   Total time 15 minutes, over half spent face-to-face with patient in counseling and coordination of care.   Nelida Meuse III

## 2017-10-09 NOTE — Patient Instructions (Signed)
If you are age 72 or older, your body mass index should be between 23-30. Your Body mass index is 37.03 kg/m. If this is out of the aforementioned range listed, please consider follow up with your Primary Care Provider.  If you are age 36 or younger, your body mass index should be between 19-25. Your Body mass index is 37.03 kg/m. If this is out of the aformentioned range listed, please consider follow up with your Primary Care Provider.   Follow up as needed.  It was a pleasure to meet you today!  Dr. Loletha Carrow

## 2017-10-23 ENCOUNTER — Ambulatory Visit: Payer: Medicare Other | Admitting: Gastroenterology

## 2018-09-07 ENCOUNTER — Encounter: Payer: Self-pay | Admitting: Women's Health

## 2018-09-22 ENCOUNTER — Encounter: Payer: Self-pay | Admitting: General Surgery

## 2018-09-23 ENCOUNTER — Encounter: Payer: Self-pay | Admitting: Gastroenterology

## 2018-09-23 ENCOUNTER — Ambulatory Visit (INDEPENDENT_AMBULATORY_CARE_PROVIDER_SITE_OTHER): Payer: Medicare Other | Admitting: Gastroenterology

## 2018-09-23 ENCOUNTER — Other Ambulatory Visit: Payer: Self-pay

## 2018-09-23 VITALS — Ht 65.0 in | Wt 223.0 lb

## 2018-09-23 DIAGNOSIS — R1032 Left lower quadrant pain: Secondary | ICD-10-CM

## 2018-09-23 DIAGNOSIS — Z8719 Personal history of other diseases of the digestive system: Secondary | ICD-10-CM | POA: Diagnosis not present

## 2018-09-23 DIAGNOSIS — Z1211 Encounter for screening for malignant neoplasm of colon: Secondary | ICD-10-CM | POA: Diagnosis not present

## 2018-09-23 NOTE — Progress Notes (Signed)
This patient contacted our office requesting a physician telemedicine consultation regarding clinical questions and/or test results. Interactive audio and video telecommunications were attempted between this provider and the patient.  However, this technology failed due to the patient having technical difficulties OR they did not have access to video capabilities.  We continued and completed the visit with audio only.  Participants on the conference : myself and patient   The patient consented to this consultation and was aware that a charge will be placed through their insurance.  I was in my office and the patient was at home   Encounter time:  Total time 18 minutes, with 13 minutes spent with patient on phone   _____________________________________________________________________________________________               [facility] Progress Note  Chief Complaint: History of diverticulitis, lower abdominal pain  Subjective  History: Rhianne was last seen about a year ago, slowly recovering from a severe protracted case of acute diverticulitis. She is due for a screening colonoscopy, having had her last with Dr. Olevia Perches in June 2010.  Has had a week of bloating, LLQ pain,one loose stool few days ago, slowly subsiding with a feeling of constipation today.  Liquid diet for first few days. No fever, chills.  Has not to push on the area. No bleeding.  I reviewed Dr. Nichola Sizer office note and a CT scan report from August 2016 when it was suspected the patient had acute diverticulitis.  No findings on CT scan, but patient improved over several days of Augmentin. [name] had not recall that particular episode.  These current symptoms bothered her a lot for several days, kept her up some at night but are improved over the last 2 days.  ROS: Cardiovascular:  no chest pain Respiratory: no dyspnea  The patient's Past Medical, Family and Social History were reviewed and are on file in the EMR. He  has not had any significant changes to her health since I last saw her a year ago.  Objective:  Med list reviewed  Current Outpatient Medications:  .  clobetasol ointment (TEMOVATE) 5.80 %, Apply 1 application topically 2 (two) times daily. (Patient taking differently: Apply 1 application topically as needed. ), Disp: 30 g, Rfl: 12 .  lisinopril (PRINIVIL,ZESTRIL) 40 MG tablet, Take 40 mg by mouth daily., Disp: , Rfl:  .  Magnesium 250 MG TABS, Take 250 mg by mouth daily., Disp: , Rfl:  .  omeprazole (PRILOSEC OTC) 20 MG tablet, Take 20 mg by mouth daily.  , Disp: , Rfl:  .  spironolactone (ALDACTONE) 25 MG tablet, Take 25 mg by mouth daily., Disp: , Rfl:     No exam - phone visit   @ASSESSMENTPLANBEGIN @ Assessment: Encounter Diagnoses  Name Primary?  . Abdominal pain, left lower quadrant Yes  . Colon cancer screening   . History of diverticulitis     It is not clear that these recent symptoms have been diverticulitis, and she reports they are improving over the last 2 days.  We would like to be cautious with antibiotics, so we will hold off for now and monitor symptoms.  She will certainly call us if symptoms are escalating.  Conner knows she is due for a screening colonoscopy this year, and would like to talk about scheduling that in July.  We talked about the need to contact me if diverticulitis or other symptoms seem to be occurring prior to that procedure.  We would not want to do a  screening colonoscopy in the setting of acute diverticulitis due to increased risk of perforation.  Plan:  My office staff will contact her to arrange a screening colonoscopy for next month.  Nelida Meuse III

## 2018-09-24 ENCOUNTER — Telehealth: Payer: Self-pay | Admitting: Gastroenterology

## 2018-09-24 ENCOUNTER — Other Ambulatory Visit: Payer: Self-pay

## 2018-09-24 DIAGNOSIS — Z8719 Personal history of other diseases of the digestive system: Secondary | ICD-10-CM

## 2018-09-24 DIAGNOSIS — Z1211 Encounter for screening for malignant neoplasm of colon: Secondary | ICD-10-CM

## 2018-09-24 MED ORDER — NA SULFATE-K SULFATE-MG SULF 17.5-3.13-1.6 GM/177ML PO SOLN: 1.0000 | Freq: Once | ORAL | 0 refills | Status: AC

## 2018-09-24 MED ORDER — CIPROFLOXACIN HCL 500 MG PO TABS: 500.0000 mg | ORAL_TABLET | Freq: Two times a day (BID) | ORAL | 0 refills | Status: AC

## 2018-09-24 MED ORDER — METRONIDAZOLE 500 MG PO TABS: 500.0000 mg | ORAL_TABLET | Freq: Three times a day (TID) | ORAL | 0 refills | Status: AC

## 2018-09-24 NOTE — Telephone Encounter (Signed)
Pt requested a call back to discuss virtual visit yesterday.  Pt reported that symptoms are still occurring.

## 2018-09-24 NOTE — Telephone Encounter (Signed)
Pt states that she had pain all night long and still has it this morning. She wants to go ahead and proceed with the next step whether it be antibiotics or a scan. Please advise.

## 2018-09-24 NOTE — Telephone Encounter (Signed)
Suspected diverticulitis  I sent prescriptions for 7 days of ciprofloxacin and metronidazole  I'd like to hear from her by mid next week with how she is feeling.

## 2018-09-24 NOTE — Telephone Encounter (Signed)
Spoke with pt and she is aware.

## 2018-09-28 ENCOUNTER — Telehealth: Payer: Self-pay | Admitting: Gastroenterology

## 2018-09-28 ENCOUNTER — Telehealth: Payer: Self-pay | Admitting: General Surgery

## 2018-09-28 NOTE — Telephone Encounter (Signed)
Pt states she started on cipro and flagyl on Friday. States she is still having pain on her left side that radiates around to her back, states it feels like where her kidneys are. She is having diarrhea and her stools are very dark. Pt thinks she may have seen a little blood in the stool. Reports the pain is all across her stomach also.Pt not sure is this is due to the meds or not. Please advise.

## 2018-09-28 NOTE — Telephone Encounter (Signed)
Patient called said that she is feeling nausea, pain on left side and back, and blood in stool. Patient also said she is on anti-biotics. Would like to speak to someone. She had a phone visit on 09-23-18

## 2018-09-28 NOTE — Telephone Encounter (Signed)
-----   Message from Algernon Huxley, RN sent at 09/28/2018  4:19 PM EDT ----- Regarding: Add-on Pt added on for tomorrow at 10am with Danis.

## 2018-09-28 NOTE — Telephone Encounter (Signed)
I'm not sure what is going on with her, and need to examine her. Please have her come in tomorrow morning at 9:45 for check-in and 10 AM visit

## 2018-09-28 NOTE — Telephone Encounter (Signed)
Covid-19 screening questions  Have you traveled in the last 14 days? If yes where? NO  Do you now or have you had a fever in the last 14 days? NO  Do you have any respiratory symptoms of shortness of breath or cough now or in the last 14 days? NO  Do you have any family members or close contacts with diagnosed or suspected Covid-19 in the past 14 days? NO  Have you been tested for Covid-19 and found to be positive? NO   Pt instructed to wear a mask and come to the appointment alone. If she is not able to be alone her caregiver will need to be masked as well. The patient verbalized understanding.

## 2018-09-28 NOTE — Telephone Encounter (Signed)
Spoke with pt and she is aware, appt scheduled.

## 2018-09-29 ENCOUNTER — Encounter: Payer: Self-pay | Admitting: Gastroenterology

## 2018-09-29 ENCOUNTER — Ambulatory Visit (INDEPENDENT_AMBULATORY_CARE_PROVIDER_SITE_OTHER): Payer: Medicare Other | Admitting: Gastroenterology

## 2018-09-29 VITALS — BP 122/84 | HR 70 | Temp 98.4°F | Ht 65.0 in | Wt 227.5 lb

## 2018-09-29 DIAGNOSIS — R140 Abdominal distension (gaseous): Secondary | ICD-10-CM | POA: Diagnosis not present

## 2018-09-29 DIAGNOSIS — R110 Nausea: Secondary | ICD-10-CM

## 2018-09-29 DIAGNOSIS — R197 Diarrhea, unspecified: Secondary | ICD-10-CM

## 2018-09-29 DIAGNOSIS — R1030 Lower abdominal pain, unspecified: Secondary | ICD-10-CM

## 2018-09-29 MED ORDER — DICYCLOMINE HCL 10 MG PO CAPS: 10.0000 mg | ORAL_CAPSULE | Freq: Three times a day (TID) | ORAL | 0 refills | Status: DC

## 2018-09-29 NOTE — Progress Notes (Signed)
[facility] Progress Note  Chief Complaint: Left lower quadrant pain and diarrhea  Subjective  History:  Recent telemedicine for history of colon polyps.  She had severe diverticulitis a year ago requiring hospitalization and prolonged course of 2 rounds of antibiotics. Recently had left-sided abdominal and flank pain with diarrhea, unclear trigger.  Seem to get better by the time I spoke with her, was not entirely consistent with her previous diverticulitis.  She called back a day or 2 later saying symptoms have recurred, so was put on ciprofloxacin and Flagyl late last week.  She called yesterday still not feeling well and was brought in for visit today.   The diarrhea had apparently resolved before she called back later in the week, but has since returned but she feels it is related to the antibiotics.  She is having some mid abdominal discomfort and generalized bloating.  Yesterday she had a dull flank pain described as an ache.  She denies dysuria or hematuria.  Her stool was dark yesterday. No fever or vomiting.  The antibiotics are making her nauseated.  Overall, Michelle Coleman does not feel like this was behaving like her previous diverticulitis.  ROS: Cardiovascular:  no chest pain Respiratory: no dyspnea Remainder of systems negative except as above The patient's Past Medical, Family and Social History were reviewed and are on file in the EMR.  Objective:  Med list reviewed  Current Outpatient Medications:  .  ciprofloxacin (CIPRO) 500 MG tablet, Take 1 tablet (500 mg total) by mouth 2 (two) times daily for 7 days., Disp: 14 tablet, Rfl: 0 .  clobetasol ointment (TEMOVATE) 9.98 %, Apply 1 application topically 2 (two) times daily. (Patient taking differently: Apply 1 application topically as needed. ), Disp: 30 g, Rfl: 12 .  lisinopril (PRINIVIL,ZESTRIL) 40 MG tablet, Take 40 mg by mouth daily., Disp: , Rfl:  .  Magnesium 250 MG TABS, Take 250 mg by mouth daily., Disp: ,  Rfl:  .  metroNIDAZOLE (FLAGYL) 500 MG tablet, Take 1 tablet (500 mg total) by mouth 3 (three) times daily for 7 days., Disp: 21 tablet, Rfl: 0 .  omeprazole (PRILOSEC OTC) 20 MG tablet, Take 20 mg by mouth daily.  , Disp: , Rfl:  .  spironolactone (ALDACTONE) 25 MG tablet, Take 25 mg by mouth daily., Disp: , Rfl:  .  dicyclomine (BENTYL) 10 MG capsule, Take 1 capsule (10 mg total) by mouth 3 (three) times daily before meals for 7 days., Disp: 21 capsule, Rfl: 0   Vital signs in last 24 hrs: Vitals:   09/29/18 0945  BP: 122/84  Pulse: 70  Temp: 98.4 F (36.9 C)    Physical Exam  Not acutely ill-appearing.  Well-hydrated.   HEENT: sclera anicteric, oral mucosa moist without lesions  Neck: supple, no thyromegaly, JVD or lymphadenopathy  Cardiac: RRR without murmurs, S1S2 heard, no peripheral edema  Pulm: clear to auscultation bilaterally, normal RR and effort noted  Abdomen: soft, epigastric and periumbilical tenderness, with active bowel sounds. No guarding or palpable hepatosplenomegaly.  No left-sided abdominal or flank pain, no CVA tenderness    Skin; warm and dry, no jaundice or rash   @ASSESSMENTPLANBEGIN @ Assessment: Encounter Diagnoses  Name Primary?  . Lower abdominal pain Yes  . Abdominal bloating   . Acute diarrhea   . Nausea without vomiting     The nature of her recent symptoms is unclear.  I still wonder if she could have had an infectious illness and now  some postinfectious symptoms.  It does not seem typical for her previous diverticulitis, and the antibiotics are not agreeing with her.  I advised her to stop the ciprofloxacin and Flagyl, gave her a trial of dicyclomine 10 mg 3 times a day for a week, and further monitoring.  I offered to send her for a CT abdomen and pelvis this week, but she would prefer to wait and see what happens with the above plan.  She will contact us in several days with an update.   Total time 28 minutes, over half spent  face-to-face with patient in counseling and coordination of care.   Nelida Meuse III

## 2018-09-29 NOTE — Patient Instructions (Addendum)
If you are age 73 or older, your body mass index should be between 23-30. Your Body mass index is 37.86 kg/m. If this is out of the aforementioned range listed, please consider follow up with your Primary Care Provider.  If you are age 16 or younger, your body mass index should be between 19-25. Your Body mass index is 37.86 kg/m. If this is out of the aformentioned range listed, please consider follow up with your Primary Care Provider.   We have sent the following medications to your pharmacy for you to pick up at your convenience: Bentyl  It was a pleasure to see you today!  Dr. Loletha Carrow

## 2018-10-04 ENCOUNTER — Other Ambulatory Visit: Payer: Self-pay

## 2018-10-04 ENCOUNTER — Other Ambulatory Visit: Payer: Medicare Other

## 2018-10-04 ENCOUNTER — Telehealth: Payer: Self-pay | Admitting: Gastroenterology

## 2018-10-04 DIAGNOSIS — R197 Diarrhea, unspecified: Secondary | ICD-10-CM

## 2018-10-04 NOTE — Telephone Encounter (Signed)
Patient called and stated Dr. Loletha Carrow wanted her to let us know how she was doing a few days after being on Bentyl. States her nausea is a little better, but the Abdominal pain and Diarrhea is not. Had 8 BMs  yesterday that were mostly liquid with parts formed. Today has had 3 BMs mostly liquid with a few hard pieces. Abdominal pain is now all over abdomin. Dull/ constant/ Bloating/gases feeling. Please advise

## 2018-10-04 NOTE — Telephone Encounter (Signed)
Patient called in wanting to speak with the nurse. She stated that she was instructed by the doctor to call back and  Speak with the nurse to let them know how she was doing.

## 2018-10-04 NOTE — Telephone Encounter (Signed)
Orders in Epic for GI pathogen panel and C Diff PCR. Patient called and she will come into our lab today to get them done

## 2018-10-04 NOTE — Telephone Encounter (Signed)
Sorry to hear she is not feeling well.  We had better get some stool studies to look for infection, especially since the diarrhea has escalated.  Please arrange GI pathogen panel and a stool for C diff PCR     ASAP

## 2018-10-07 LAB — GASTROINTESTINAL PATHOGEN PANEL PCR
C. difficile Tox A/B, PCR: UNDETERMINED — AB
Campylobacter, PCR: UNDETERMINED — AB
Cryptosporidium, PCR: UNDETERMINED — AB
E coli (ETEC) LT/ST PCR: UNDETERMINED — AB
E coli (STEC) stx1/stx2, PCR: UNDETERMINED — AB
E coli 0157, PCR: UNDETERMINED — AB
Giardia lamblia, PCR: UNDETERMINED — AB
Norovirus, PCR: UNDETERMINED — AB
Rotavirus A, PCR: UNDETERMINED — AB
Salmonella, PCR: UNDETERMINED — AB
Shigella, PCR: UNDETERMINED — AB

## 2018-10-07 LAB — CLOSTRIDIUM DIFFICILE TOXIN B, QUALITATIVE, REAL-TIME PCR: Toxigenic C. Difficile by PCR: NOT DETECTED

## 2018-10-08 NOTE — Progress Notes (Signed)
The lab appears to have had a malfunction with the testing on the specimen for the GI path panel, so no result.  The most important was the C diff, which is negative.  I am aware that she was previously scheduled for a colonoscopy on 7/16, but her symptoms have worsened since then.  So I would still like you to offer this patient an earlier colonoscopy with me if the schedule allows and she is agreeable.  There appear to be a couple slots left on 6/25.  July 9th available as well.

## 2018-10-27 ENCOUNTER — Telehealth: Payer: Self-pay | Admitting: Gastroenterology

## 2018-10-27 NOTE — Telephone Encounter (Signed)

## 2018-10-28 ENCOUNTER — Encounter: Payer: Self-pay | Admitting: Gastroenterology

## 2018-10-28 ENCOUNTER — Other Ambulatory Visit: Payer: Self-pay

## 2018-10-28 ENCOUNTER — Ambulatory Visit (AMBULATORY_SURGERY_CENTER): Payer: Medicare Other | Admitting: Gastroenterology

## 2018-10-28 VITALS — BP 142/83 | HR 64 | Temp 97.8°F | Resp 16 | Ht 65.0 in | Wt 227.0 lb

## 2018-10-28 DIAGNOSIS — D122 Benign neoplasm of ascending colon: Secondary | ICD-10-CM

## 2018-10-28 DIAGNOSIS — R1030 Lower abdominal pain, unspecified: Secondary | ICD-10-CM

## 2018-10-28 DIAGNOSIS — K529 Noninfective gastroenteritis and colitis, unspecified: Secondary | ICD-10-CM

## 2018-10-28 DIAGNOSIS — K5730 Diverticulosis of large intestine without perforation or abscess without bleeding: Secondary | ICD-10-CM | POA: Diagnosis not present

## 2018-10-28 MED ORDER — SODIUM CHLORIDE 0.9 % IV SOLN: 500.0000 mL | Freq: Once | INTRAVENOUS | Status: DC

## 2018-10-28 NOTE — Progress Notes (Signed)
To PACU, VSS. Report to [email or web address].tb 

## 2018-10-28 NOTE — Op Note (Signed)
[facility] Patient Name: Michelle Coleman Procedure Date: 10/28/2018 8:03 AM MRN: [id number] Endoscopist: Mallie Mussel L. Loletha Carrow , MD Age: 73 Referring MD:  Date of Birth: 1945/09/24 Gender: Female Account #: [id number] Procedure:                Colonoscopy Indications:              Generalized abdominal pain, , Clinically                            significant diarrhea of unexplained origin                            (symptoms have now largely abated) Medicines:                Monitored Anesthesia Care Procedure:                Pre-Anesthesia Assessment:                           - Prior to the procedure, a History and Physical                            was performed, and patient medications and                            allergies were reviewed. The patient's tolerance of                            previous anesthesia was also reviewed. The risks                            and benefits of the procedure and the sedation                            options and risks were discussed with the patient.                            All questions were answered, and informed consent                            was obtained. Prior Anticoagulants: The patient has                            taken no previous anticoagulant or antiplatelet                            agents. ASA Grade Assessment: II - A patient with                            mild systemic disease. After reviewing the risks                            and benefits, the patient was deemed in  satisfactory condition to undergo the procedure.                           After obtaining informed consent, the colonoscope                            was passed under direct vision. Throughout the                            procedure, the patient's blood pressure, pulse, and                            oxygen saturations were monitored continuously. The                            Colonoscope was introduced through the anus  and                            advanced to the the cecum, identified by                            appendiceal orifice and ileocecal valve. The                            colonoscopy was performed without difficulty. The                            patient tolerated the procedure well. Scope In: 9:23:00 AM Scope Out: 9:35:40 AM Scope Withdrawal Time: 0 hours 8 minutes 42 seconds  Total Procedure Duration: 0 hours 12 minutes 40 seconds  Findings:                 The perianal and digital rectal examinations were                            normal.                           Multiple diverticula were found in the left colon                            and right colon.                           A diminutive polyp was found in the ascending                            colon. The polyp was sessile. The polyp was removed                            with a cold biopsy forceps. Resection and retrieval                            were complete.  Normal mucosa was found in the entire colon.                           The exam was otherwise without abnormality on                            direct and retroflexion views. Complications:            No immediate complications. Estimated Blood Loss:     Estimated blood loss: none. Estimated blood loss:                            none. Estimated blood loss was minimal. Impression:               - Diverticulosis in the left colon and in the right                            colon.                           - One diminutive polyp in the ascending colon,                            removed with a cold biopsy forceps. Resected and                            retrieved.                           - Normal mucosa in the entire examined colon.                           - The examination was otherwise normal on direct                            and retroflexion views.                           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 Recommendation:           - Patient has a contact number available for                            emergencies. The signs and symptoms of potential                            delayed complications were discussed with the                            patient. Return to normal activities tomorrow.                            Written discharge instructions were provided to the  patient.                           - Resume previous diet.                           - Continue present medications.                           - Patient has a contact number available for                            emergencies. The signs and symptoms of potential                            delayed complications were discussed with the                            patient. Return to normal activities tomorrow.                            Written discharge instructions were provided to the                            patient.                           - Resume previous diet.                           - Continue present medications.                           - Await pathology results.                           - Based on current guidelines, no repeat                            surveillance colonoscopy necessary. Henry L. Loletha Carrow, MD 10/28/2018 9:43:32 AM This report has been signed electronically.

## 2018-10-28 NOTE — Progress Notes (Signed)
[name] VS- Rica Mote

## 2018-10-28 NOTE — Patient Instructions (Signed)
YOU HAD AN ENDOSCOPIC PROCEDURE TODAY AT THE [facility]:   Refer to the procedure report that was given to you for any specific questions about what was found during the examination.  If the procedure report does not answer your questions, please call your gastroenterologist to clarify.  If you requested that your care partner not be given the details of your procedure findings, then the procedure report has been included in a sealed envelope for you to review at your convenience later.  YOU SHOULD EXPECT: Some feelings of bloating in the abdomen. Passage of more gas than usual.  Walking can help get rid of the air that was put into your GI tract during the procedure and reduce the bloating. If you had a lower endoscopy (such as a colonoscopy or flexible sigmoidoscopy) you may notice spotting of blood in your stool or on the toilet paper. If you underwent a bowel prep for your procedure, you may not have a normal bowel movement for a few days.  Please Note:  You might notice some irritation and congestion in your nose or some drainage.  This is from the oxygen used during your procedure.  There is no need for concern and it should clear up in a day or so.  SYMPTOMS TO REPORT IMMEDIATELY:   Following lower endoscopy (colonoscopy or flexible sigmoidoscopy):  Excessive amounts of blood in the stool  Significant tenderness or worsening of abdominal pains  Swelling of the abdomen that is new, acute  Fever of 100F or higher   For urgent or emergent issues, a gastroenterologist can be reached at any hour by calling [phone].   DIET:  We do recommend a small meal at first, but then you may proceed to your regular diet.  Drink plenty of fluids but you should avoid alcoholic beverages for 24 hours.  MEDICATIONS: Continue present medications.  Please see handouts given to you by your recovery nurse.  ACTIVITY:  You should plan to take it easy for the rest of today and you should  NOT DRIVE or use heavy machinery until tomorrow (because of the sedation medicines used during the test).    FOLLOW UP: Our staff will call the number listed on your records 48-72 hours following your procedure to check on you and address any questions or concerns that you may have regarding the information given to you following your procedure. If we do not reach you, we will leave a message.  We will attempt to reach you two times.  During this call, we will ask if you have developed any symptoms of COVID 19. If you develop any symptoms (ie: fever, flu-like symptoms, shortness of breath, cough etc.) before then, please call [phone].  If you test positive for Covid 19 in the 2 weeks post procedure, please call and report this information to us.    If any biopsies were taken you will be contacted by phone or by letter within the next 1-3 weeks.  Please call us at [phone] if you have not heard about the biopsies in 3 weeks.   Thank you for allowing us to provide for your healthcare needs today.   SIGNATURES/CONFIDENTIALITY: You and/or your care partner have signed paperwork which will be entered into your electronic medical record.  These signatures attest to the fact that that the information above on your After Visit Summary has been reviewed and is understood.  Full responsibility of the confidentiality of this discharge information lies with you and/or   your care-partner. 

## 2018-10-28 NOTE — Progress Notes (Signed)
Called to room to assist during endoscopic procedure.  Patient ID and intended procedure confirmed with present staff. Received instructions for my participation in the procedure from the performing physician.  

## 2018-10-31 ENCOUNTER — Telehealth: Payer: Self-pay | Admitting: Nurse Practitioner

## 2018-10-31 ENCOUNTER — Emergency Department (HOSPITAL_COMMUNITY): Payer: Medicare Other

## 2018-10-31 ENCOUNTER — Other Ambulatory Visit: Payer: Self-pay

## 2018-10-31 ENCOUNTER — Encounter (HOSPITAL_COMMUNITY): Payer: Self-pay | Admitting: Emergency Medicine

## 2018-10-31 ENCOUNTER — Emergency Department (HOSPITAL_COMMUNITY)
Admission: EM | Admit: 2018-10-31 | Discharge: 2018-10-31 | Disposition: A | Discharge status: Home / Self Care | Payer: Medicare Other | Attending: Emergency Medicine | Admitting: Emergency Medicine

## 2018-10-31 DIAGNOSIS — K5792 Diverticulitis of intestine, part unspecified, without perforation or abscess without bleeding: Secondary | ICD-10-CM | POA: Diagnosis not present

## 2018-10-31 DIAGNOSIS — R1032 Left lower quadrant pain: Secondary | ICD-10-CM | POA: Diagnosis present

## 2018-10-31 DIAGNOSIS — I129 Hypertensive chronic kidney disease with stage 1 through stage 4 chronic kidney disease, or unspecified chronic kidney disease: Secondary | ICD-10-CM | POA: Diagnosis not present

## 2018-10-31 DIAGNOSIS — J189 Pneumonia, unspecified organism: Secondary | ICD-10-CM

## 2018-10-31 DIAGNOSIS — Z79899 Other long term (current) drug therapy: Secondary | ICD-10-CM | POA: Insufficient documentation

## 2018-10-31 DIAGNOSIS — J181 Lobar pneumonia, unspecified organism: Secondary | ICD-10-CM | POA: Diagnosis not present

## 2018-10-31 DIAGNOSIS — N183 Chronic kidney disease, stage 3 (moderate): Secondary | ICD-10-CM | POA: Diagnosis not present

## 2018-10-31 LAB — COMPREHENSIVE METABOLIC PANEL
ALT: 14 U/L (ref 0–44)
AST: 14 U/L — ABNORMAL LOW (ref 15–41)
Albumin: 3.7 g/dL (ref 3.5–5.0)
Alkaline Phosphatase: 65 U/L (ref 38–126)
Anion gap: 11 (ref 5–15)
BUN: 15 mg/dL (ref 8–23)
CO2: 24 mmol/L (ref 22–32)
Calcium: 9.4 mg/dL (ref 8.9–10.3)
Chloride: 103 mmol/L (ref 98–111)
Creatinine, Ser: 0.81 mg/dL (ref 0.44–1.00)
GFR calc Af Amer: >60 mL/min (ref >60)
GFR calc non Af Amer: >60 mL/min (ref >60)
Glucose, Bld: 94 mg/dL (ref 70–99)
Potassium: 4 mmol/L (ref 3.5–5.1)
Sodium: 138 mmol/L (ref 135–145)
Total Bilirubin: 0.8 mg/dL (ref 0.3–1.2)
Total Protein: 7.4 g/dL (ref 6.5–8.1)

## 2018-10-31 LAB — LACTIC ACID, PLASMA: Lactic Acid, Venous: 0.9 mmol/L (ref 0.5–1.9)

## 2018-10-31 LAB — CBC
HCT: 43.3 % (ref 36.0–46.0)
Hemoglobin: 13.9 g/dL (ref 12.0–15.0)
MCH: 29.3 pg (ref 26.0–34.0)
MCHC: 32.1 g/dL (ref 30.0–36.0)
MCV: 91.2 fL (ref 80.0–100.0)
Platelets: 190 10*3/uL (ref 150–400)
RBC: 4.75 MIL/uL (ref 3.87–5.11)
RDW: 14.2 % (ref 11.5–15.5)
WBC: 10.9 10*3/uL — ABNORMAL HIGH (ref 4.0–10.5)
nRBC: 0 % (ref 0.0–0.2)

## 2018-10-31 LAB — URINALYSIS, ROUTINE W REFLEX MICROSCOPIC
Bilirubin Urine: NEGATIVE
Glucose, UA: NEGATIVE mg/dL
Hgb urine dipstick: NEGATIVE
Ketones, ur: NEGATIVE mg/dL
Nitrite: NEGATIVE
Protein, ur: NEGATIVE mg/dL
Specific Gravity, Urine: 1.009 (ref 1.005–1.030)
pH: 5 (ref 5.0–8.0)

## 2018-10-31 LAB — LIPASE, BLOOD: Lipase: 25 U/L (ref 11–51)

## 2018-10-31 MED ORDER — AMOXICILLIN-POT CLAVULANATE 875-125 MG PO TABS: 1.0000 | ORAL_TABLET | Freq: Two times a day (BID) | ORAL | 0 refills | Status: DC

## 2018-10-31 MED ORDER — SODIUM CHLORIDE (PF) 0.9 % IJ SOLN
INTRAMUSCULAR | Status: AC
  Filled 2018-10-31: qty 50

## 2018-10-31 MED ORDER — PIPERACILLIN-TAZOBACTAM 3.375 G IVPB 30 MIN
3.3750 g | Freq: Once | INTRAVENOUS | Status: AC
  Administered 2018-10-31: 3.375 g via INTRAVENOUS
  Filled 2018-10-31: qty 50

## 2018-10-31 MED ORDER — IOHEXOL 300 MG/ML  SOLN
100.0000 mL | Freq: Once | INTRAMUSCULAR | Status: AC | PRN
  Administered 2018-10-31: 100 mL via INTRAVENOUS

## 2018-10-31 MED ORDER — ACETAMINOPHEN 325 MG PO TABS
650.0000 mg | ORAL_TABLET | Freq: Once | ORAL | Status: AC
  Administered 2018-10-31: 650 mg via ORAL
  Filled 2018-10-31: qty 2

## 2018-10-31 MED ORDER — SODIUM CHLORIDE 0.9 % IV BOLUS
1000.0000 mL | Freq: Once | INTRAVENOUS | Status: AC
  Administered 2018-10-31: 1000 mL via INTRAVENOUS

## 2018-10-31 MED ORDER — SODIUM CHLORIDE 0.9% FLUSH: 3.0000 mL | Freq: Once | INTRAVENOUS | Status: DC

## 2018-10-31 NOTE — ED Notes (Signed)
Patient will be discharged once IV Antibiotics are complete.

## 2018-10-31 NOTE — ED Triage Notes (Signed)
Patient here from home with complaints of left sided abd pain that started after colonoscopy on Thursday. Denies rectal bleeding. Denies n/v.

## 2018-10-31 NOTE — ED Notes (Signed)
Patient ambulated to restroom and back to room without complication or assistance. 

## 2018-10-31 NOTE — ED Provider Notes (Signed)
[facility] DEPT Provider Note   CSN: [id number] Arrival date & time: 10/31/18  1359    History   Chief Complaint Chief Complaint  Patient presents with  . Abdominal Pain    HPI Michelle Coleman is a 73 y.o. female history of CKD, diverticulitis, hypertension here presenting with left lower quadrant pain, fever.  Patient states that she has been having intermittent left-sided abdominal pain for the last several months.  She has been seeing her GI doctor, Dr. Pincus Sanes.  She had a colonoscopy done 3 days ago and showed diverticulosis and a polyp was removed.  The procedure appears uncomplicated at that time.  Since then, she has worsening left lower quadrant pain.  She had some chills and low-grade temperature 99 at home today.  Denies any rectal bleeding or vomiting.  Patient however felt nauseated.  Denies any cough or shortness of breath.     The history is provided by the patient.    Past Medical History:  Diagnosis Date  . Arthritis    "lower back" (08/26/2017)  . CKD (chronic kidney disease), stage III ([place])   . Diverticulitis   . GERD (gastroesophageal reflux disease)   . History of hiatal hernia   . Hx of cholecystectomy   . Hypertension   . Migraine ~ 2004 X 1  . PONV (postoperative nausea and vomiting)   . 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 Skin abnormalities 04/930   lichens sclerosis, kerotosis ( 11/2006)    Patient Active Problem List   Diagnosis Date Noted  . Acute diverticulitis 08/26/2017  . Chest pain 07/02/2012  . Hematuria 06/30/2012  . Skin abnormalities   . Obesity, unspecified 08/23/2008  . ARTHRITIS 08/23/2008  . Essential hypertension 08/18/2008  . GERD 08/18/2008  . DIVERTICULOSIS OF COLON 08/18/2008  . DIVERTICULITIS, COLON, WITH PERFORATION 08/18/2008  . RENAL CYST, LEFT 08/18/2008    Past Surgical History:  Procedure Laterality Date  . BUNIONECTOMY Right 1984  . COLONOSCOPY    . CYSTECTOMY Right 1983   facial  cyst  . HEMORROIDECTOMY     "lanced them"  . LAPAROSCOPIC CHOLECYSTECTOMY  1992  . SHOULDER ARTHROSCOPY W/ ROTATOR CUFF REPAIR Right 04/2010  . TONSILLECTOMY AND ADENOIDECTOMY  1950  . UPPER GASTROINTESTINAL ENDOSCOPY       OB History    Gravida  2   Para  2   Term      Preterm      AB      Living  2     SAB      TAB      Ectopic      Multiple      Live Births               Home Medications    Prior to Admission medications   Medication Sig Start Date End Date Taking? Authorizing Provider  clobetasol ointment (TEMOVATE) 3.55 % Apply 1 application topically 2 (two) times daily. Patient taking differently: Apply 1 application topically 2 (two) times daily as needed (dry skin).  01/28/17  Yes Huel Cote, NP  lisinopril (PRINIVIL,ZESTRIL) 40 MG tablet Take 40 mg by mouth daily.   Yes [provider]  omeprazole (PRILOSEC OTC) 20 MG tablet Take 20 mg by mouth daily.     Yes [provider]  spironolactone (ALDACTONE) 25 MG tablet Take 25 mg by mouth daily.   Yes [provider]  dicyclomine (BENTYL) 10 MG capsule Take  1 capsule (10 mg total) by mouth 3 (three) times daily before meals for 7 days. Patient not taking: Reported on 10/31/2018 09/29/18 10/06/18  Doran Stabler, MD    Family History Family History  Problem Relation Age of Onset  . Hypertension Mother   . Heart disease Mother   . Stroke Mother   . Uterine cancer Maternal Grandmother   . Stomach cancer Maternal Grandfather   . Heart disease Maternal Uncle   . Other Father        Father was un-known to patient  . Colon cancer Neg Hx   . Esophageal cancer Neg Hx   . Rectal cancer Neg Hx     Social History Social History   Tobacco Use  . Smoking status: Never Smoker  . Smokeless tobacco: Never Used  Substance Use Topics  . Alcohol use: No  . Drug use: No     Allergies   Codeine and Morphine   Review of Systems Review of Systems  Gastrointestinal:  Positive for abdominal pain.  All other systems reviewed and are negative.    Physical Exam Updated Vital Signs BP 131/67 (BP Location: Right Arm)   Pulse 69   Temp (S) 98.2 F (36.8 C) (Oral)   Resp (!) 21   Wt 103.8 kg   LMP 02/13/1996   SpO2 94%   BMI 38.09 kg/m   Physical Exam Vitals signs and nursing note reviewed.  Constitutional:      Comments: Uncomfortable   HENT:     Head: Normocephalic.     Mouth/Throat:     Mouth: Mucous membranes are moist.  Eyes:     Extraocular Movements: Extraocular movements intact.  Cardiovascular:     Rate and Rhythm: Normal rate and regular rhythm.     Heart sounds: Normal heart sounds.  Pulmonary:     Effort: Pulmonary effort is normal.     Breath sounds: Normal breath sounds.  Abdominal:     Comments: + LUQ and LLQ tenderness, no guarding   Skin:    General: Skin is warm.     Capillary Refill: Capillary refill takes less than 2 seconds.  Neurological:     General: No focal deficit present.     Mental Status: She is alert and oriented to person, place, and time.  Psychiatric:        Mood and Affect: Mood normal.      ED Treatments / Results  Labs (all labs ordered are listed, but only abnormal results are displayed) Labs Reviewed  COMPREHENSIVE METABOLIC PANEL - Abnormal; Notable for the following components:      Result Value   AST 14 (*)    All other components within normal limits  CBC - Abnormal; Notable for the following components:   WBC 10.9 (*)    All other components within normal limits  URINALYSIS, ROUTINE W REFLEX MICROSCOPIC - Abnormal; Notable for the following components:   Leukocytes,Ua TRACE (*)    Bacteria, UA RARE (*)    All other components within normal limits  CULTURE, BLOOD (ROUTINE X 2)  CULTURE, BLOOD (ROUTINE X 2)  URINE CULTURE  LIPASE, BLOOD  LACTIC ACID, PLASMA    EKG None  Radiology Ct Abdomen Pelvis W Contrast  Result Date: 10/31/2018 CLINICAL DATA:  Left mid abdominal  pain. Recent colonoscopy. EXAM: CT ABDOMEN AND PELVIS WITH CONTRAST TECHNIQUE: Multidetector CT imaging of the abdomen and pelvis was performed using the standard protocol following bolus administration of intravenous contrast.  CONTRAST:  161mL OMNIPAQUE IOHEXOL 300 MG/ML  SOLN COMPARISON:  Sep 18, 2017 FINDINGS: Lower chest: Left lower lobe subsegmental atelectasis versus mild airspace consolidation. Hepatobiliary: Mild hepatic steatosis. 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 Post cholecystectomy. Pancreas: Unremarkable. No pancreatic ductal dilatation or surrounding inflammatory changes. Spleen: 9 mm splenic lesion, indeterminate. Adrenals/Urinary Tract: Adrenal glands are unremarkable. Kidneys are without renal calculi, focal lesion, or hydronephrosis. Bilateral parapelvic cysts. Bladder is unremarkable. Stomach/Bowel: Stomach is within normal limits. Appendix appears normal. No evidence of small bowel wall thickening, distention, or inflammatory changes. Scattered right colonic diverticulosis. Diffuse left colonic diverticulosis. There is an inflamed diverticulum in the proximal descending colon with focal mucosal thickening and pericolonic inflammatory changes. Small amount of free fluid in the left pericolic gutter. No evidence of frank abscess formation or rupture. Vascular/Lymphatic: No significant vascular findings are present. No enlarged abdominal or pelvic lymph nodes. Reproductive: Uterus and bilateral adnexa are unremarkable. Other: No abdominal wall hernia or abnormality. Musculoskeletal: Spondylosis of the lumbosacral spine. IMPRESSION: 1. Diffuse left colonic diverticulosis. Acute diverticulitis of the proximal descending colon. Small amount of free fluid in the left pericolic gutter. No evidence of frank abscess formation or rupture. 2. Mild hepatic steatosis. 3. Bilateral parapelvic renal cysts. 4. Left lower lobe subsegmental atelectasis versus mild airspace  consolidation. Electronically Signed   By: Fidela Salisbury M.D.   On: 10/31/2018 18:16    Procedures Procedures (including critical care time)  Medications Ordered in ED Medications  sodium chloride (PF) 0.9 % injection (0 mLs  Hold 10/31/18 1754)  piperacillin-tazobactam (ZOSYN) IVPB 3.375 g (3.375 g Intravenous New Bag/Given 10/31/18 1843)  sodium chloride 0.9 % bolus 1,000 mL (0 mLs Intravenous Stopped 10/31/18 1727)  acetaminophen (TYLENOL) tablet 650 mg (650 mg Oral Given 10/31/18 1612)  iohexol (OMNIPAQUE) 300 MG/ML solution 100 mL (100 mLs Intravenous Contrast Given 10/31/18 1753)     Initial Impression / Assessment and Plan / ED Course  I have reviewed the triage vital signs and the nursing notes.  Pertinent labs & imaging results that were available during my care of the patient were reviewed by me and considered in my medical decision making (see chart for details).       Michelle Coleman is a 73 y.o. female here presenting with abdominal pain and fever after colonoscopy.  Consider small perforation versus abscess versus diverticulitis. Clinically not septic. Will get labs, lactate, cultures, CT ab/pel.   6:45 PM Labs unremarkable. CT showed mild L diverticulitis, possible pneumonia. She has no respiratory symptoms. Doesn't appear septic. Given zosyn, felt better. No vomiting in the ED. She states that she usually does not tolerate Cipro Flagyl that well and prefers Augmentin.  She will follow-up with GI next week.   Final Clinical Impressions(s) / ED Diagnoses   Final diagnoses:  None    ED Discharge Orders    None       Drenda Freeze, MD 10/31/18 [phone]

## 2018-10-31 NOTE — ED Notes (Signed)
Patient transported to CT 

## 2018-10-31 NOTE — Telephone Encounter (Signed)
Patient underwent a colonoscopy with Dr. Loletha Carrow 7/9, diverticulosis right and left colon, 1 sml polyp to the ascending colon. She denied having any abd pain immediately after procedure. Left mid abdominal pain started yesterday afternoon, described as severe. She stated her abd pain is worse today, constant. Temp 99.8. Poor appetite. Does not feel well. Prior hx of diverticulitis but her pain is usually LLQ with past diverticulitis episodes. I advised she present to the ED for exam, labs and CTAP. She elects to go to [facility] ED. I will msg Dr. Loletha Carrow to follow up with pt tomorrow.

## 2018-10-31 NOTE — Discharge Instructions (Signed)
Take augmentin twice daily for a week.   See your GI doctor next week   Stay hydrated   Return to ER if you have worse abdominal pain, vomiting, fever

## 2018-11-01 ENCOUNTER — Telehealth: Payer: Self-pay

## 2018-11-01 ENCOUNTER — Telehealth: Payer: Self-pay | Admitting: Gastroenterology

## 2018-11-01 LAB — URINE CULTURE

## 2018-11-01 NOTE — Telephone Encounter (Signed)
Attempted to reach patient for post-procedure f/u call. No answer. Unable to leave message. Staff will make another attempt to reach her later today.

## 2018-11-01 NOTE — Telephone Encounter (Signed)
1. Have you developed a fever since your procedure? no  2.   Have you had an respiratory symptoms (SOB or cough) since your procedure? no  3.   Have you tested positive for COVID 19 since your procedure no  4.   Have you had any family members/close contacts diagnosed with the COVID 19 since your procedure?  no   If yes to any of these questions please route to Joylene John, RN and Alphonsa Gin, Therapist, sports.

## 2018-11-01 NOTE — Telephone Encounter (Signed)
  Follow up Call-  Call back number 10/28/2018  Post procedure Call Back phone  # [phone]  Permission to leave phone message Yes  Some recent data might be hidden     Patient questions:  Do you have a fever, pain , or abdominal swelling? Yes.   Pain Score  3 *  Have you tolerated food without any problems? No.  Have you been able to return to your normal activities? No.  Do you have any questions about your discharge instructions: Diet   No. Medications  No. Follow up visit  No.  Do you have questions or concerns about your Care? Yes.    Actions: * If pain score is 4 or above: No action needed, pain <4.  Pt called and spoke with a nurse practicianer yesterday.  Pt was seen at [facility]  Per Pt was dx with diverticulitis and pneumonia.  Pt said she was given rx for Augmentin and told to follow up with Dr. Loletha Carrow in 1 week.  Pt was advised to call and set up appointment today.  I and just forwarding this message to Dr. Loletha Carrow and Joylene John, RN since pt went to ED. maw

## 2018-11-01 NOTE — Telephone Encounter (Signed)
Pt was seen in the ER yesterday and treated for diverticulitis. She was told by the ER to follow-up in 1 week. There are no available app appt and first available with Dr. Loletha Carrow is 8/4. Dr. Loletha Carrow please advise.

## 2018-11-03 NOTE — Telephone Encounter (Signed)
Pt scheduled for telehealth visit 11/10/18@4 :30pm. Left message for pt to call back.

## 2018-11-03 NOTE — Telephone Encounter (Signed)
Spoke with pt and she is aware.

## 2018-11-03 NOTE — Telephone Encounter (Signed)
I'm sorry to hear she is still having trouble with that again, as I know it has been frustrating for her.  Please set up a telemedicine for 4:30 on Wed, 7/22, and tell her I will call between 4:30 and 4:45

## 2018-11-04 ENCOUNTER — Encounter: Payer: Medicare Other | Admitting: Gastroenterology

## 2018-11-04 ENCOUNTER — Encounter: Payer: Self-pay | Admitting: Gastroenterology

## 2018-11-05 LAB — CULTURE, BLOOD (ROUTINE X 2)
Culture: NO GROWTH
Culture: NO GROWTH

## 2018-11-10 ENCOUNTER — Ambulatory Visit (INDEPENDENT_AMBULATORY_CARE_PROVIDER_SITE_OTHER): Payer: Medicare Other | Admitting: Gastroenterology

## 2018-11-10 ENCOUNTER — Encounter: Payer: Self-pay | Admitting: Gastroenterology

## 2018-11-10 VITALS — Ht 65.0 in | Wt 224.0 lb

## 2018-11-10 DIAGNOSIS — K5792 Diverticulitis of intestine, part unspecified, without perforation or abscess without bleeding: Secondary | ICD-10-CM | POA: Diagnosis not present

## 2018-11-10 NOTE — Progress Notes (Signed)
This patient contacted our office requesting a physician telemedicine consultation regarding clinical questions and/or test results. Due to COVID restrictions, this was felt to be the most appropriate method of patient evaluation.   Participants on the conference : myself and patient   The patient consented to this consultation and was aware that a charge will be placed through their insurance.  They were also made aware of the limitations of telemedicine.  I was in my office and the patient was at home.   Encounter time:  Total time 30 minutes, with 25 minutes spent with patient on doximity   _____________________________________________________________________________________________               Michelle Coleman GI Progress Note  Chief Complaint: acute diverticulitis  Subjective  History: Patient recently recovered from an episode of prolonged abdominal pain followed by diarrhea.  It was behaving differently than her previous episodes of diverticulitis, and it did not get better with empiric antibiotics.  Stool studies were negative for C. difficile.  By the time of recent colonoscopy, she tells me the diarrhea had finally resolved.  Left and right colon diverticulosis as well as ascending colon diminutive adenoma were found on colonoscopy July 9.  She then went to the ED on July 12 with acute left-sided abdominal pain and CT scan discovered descending colon diverticulitis noted below. Symptoms began the evening prior, then escalated with fever.  [name] received 7 days of Augmentin, and says she started feeling better within about 48 hours.  Her symptoms are now resolved and she is back to feeling well.  Understandably concerned about risk of recurrence.  ROS: Cardiovascular:  no chest pain Respiratory: no dyspnea  The patient's Past Medical, Family and Social History were reviewed and are on file in the EMR.  Objective:  Med list reviewed  Current Outpatient Medications:  .   clobetasol ointment (TEMOVATE) 0.34 %, Apply 1 application topically 2 (two) times daily. (Patient taking differently: Apply 1 application topically 2 (two) times daily as needed (dry skin). ), Disp: 30 g, Rfl: 12 .  lisinopril (PRINIVIL,ZESTRIL) 40 MG tablet, Take 40 mg by mouth daily., Disp: , Rfl:  .  omeprazole (PRILOSEC OTC) 20 MG tablet, Take 20 mg by mouth daily.  , Disp: , Rfl:  .  spironolactone (ALDACTONE) 25 MG tablet, Take 25 mg by mouth daily., Disp: , Rfl:     No exam - virtual visit  Well-appearing  Recent Labs:  CBC Latest Ref Rng & Units 10/31/2018 08/26/2017 08/25/2017  WBC 4.0 - 10.5 K/uL 10.9(H) 13.5(H) 13.0(H)  Hemoglobin 12.0 - 15.0 g/dL 13.9 13.4 14.8  Hematocrit 36.0 - 46.0 % 43.3 40.1 44.3  Platelets 150 - 400 K/uL 190 177 212   CMP Latest Ref Rng & Units 10/31/2018 08/26/2017 08/25/2017  Glucose 70 - 99 mg/dL 94 140(H) 96  BUN 8 - 23 mg/dL 15 17 17   Creatinine 0.44 - 1.00 mg/dL 0.81 0.89 0.99  Sodium 135 - 145 mmol/L 138 136 140  Potassium 3.5 - 5.1 mmol/L 4.0 4.1 4.6  Chloride 98 - 111 mmol/L 103 104 105  CO2 22 - 32 mmol/L 24 25 27   Calcium 8.9 - 10.3 mg/dL 9.4 8.7(L) 9.5  Total Protein 6.5 - 8.1 g/dL 7.4 6.1(L) 7.2  Total Bilirubin 0.3 - 1.2 mg/dL 0.8 1.1 0.8  Alkaline Phos 38 - 126 U/L 65 58 67  AST 15 - 41 U/L 14(L) 16 16  ALT 0 - 44 U/L 14 14 13(L)  Radiologic studies:  CLINICAL DATA:  Left mid abdominal pain. Recent colonoscopy.   EXAM: CT ABDOMEN AND PELVIS WITH CONTRAST   TECHNIQUE: Multidetector CT imaging of the abdomen and pelvis was performed using the standard protocol following bolus administration of intravenous contrast.   CONTRAST:  163mL OMNIPAQUE IOHEXOL 300 MG/ML  SOLN   COMPARISON:  Sep 18, 2017   FINDINGS: Lower chest: Left lower lobe subsegmental atelectasis versus mild airspace consolidation.   Hepatobiliary: Mild hepatic steatosis. 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 Post  cholecystectomy.   Pancreas: Unremarkable. No pancreatic ductal dilatation or surrounding inflammatory changes.   Spleen: 9 mm splenic lesion, indeterminate.   Adrenals/Urinary Tract: Adrenal glands are unremarkable. Kidneys are without renal calculi, focal lesion, or hydronephrosis. Bilateral parapelvic cysts. Bladder is unremarkable.   Stomach/Bowel: Stomach is within normal limits. Appendix appears normal. No evidence of small bowel wall thickening, distention, or inflammatory changes. Scattered right colonic diverticulosis. Diffuse left colonic diverticulosis. There is an inflamed diverticulum in the proximal descending colon with focal mucosal thickening and pericolonic inflammatory changes. Small amount of free fluid in the left pericolic gutter. No evidence of frank abscess formation or rupture.   Vascular/Lymphatic: No significant vascular findings are present. No enlarged abdominal or pelvic lymph nodes.   Reproductive: Uterus and bilateral adnexa are unremarkable.   Other: No abdominal wall hernia or abnormality.   Musculoskeletal: Spondylosis of the lumbosacral spine.   IMPRESSION: 1. Diffuse left colonic diverticulosis. Acute diverticulitis of the proximal descending colon. Small amount of free fluid in the left pericolic gutter. No evidence of frank abscess formation or rupture. 2. Mild hepatic steatosis. 3. Bilateral parapelvic renal cysts. 4. Left lower lobe subsegmental atelectasis versus mild airspace consolidation.     Electronically Signed   By: Fidela Salisbury M.D.   On: 10/31/2018 18:16 I personally reviewed the CT images  @ASSESSMENTPLANBEGIN @ Assessment: Encounter Diagnosis  Name Primary?  . Acute diverticulitis Yes   She had a severe episode in May 2019 with a prolonged recovery.  We do not know if her early June episode was really diverticulitis or perhaps some infectious illness.  This recent diagnosis was certainly nowhere near as bad  as the 2019 episode.  We discussed the uncertain nature of this condition, and that it appears more common in postmenopausal women for whatever reason.  Some patients might benefit from long-term mesalamine therapy, but the data is not strong on this, and she did not have any mucosal inflammation/ SCAD seen on recent colonoscopy.  I also brought up the possibility of a surgical consultation to consider elective resection, but she did not want to pursue that at this point. She also did not want to start a new medicine, especially since she is planning to make a permanent move to [place] in a few months.  Plan: I encouraged her to call me if she has recurrent symptoms.  I also want her to find a GI practice near her new home in [place] and set up a new patient appointment so she can transition care not long after moving there.  We would gladly make records available upon request.  Nelida Meuse III

## 2018-11-30 ENCOUNTER — Ambulatory Visit: Payer: Medicare Other | Admitting: Gastroenterology

## 2020-05-28 IMAGING — CT CT ABDOMEN AND PELVIS WITH CONTRAST
2 of 5 series · 16 of 46 positions shown, 18 images · IV contrast (ISOVUE)
Comparison: September 18, 2017

CLINICAL DATA: Left mid abdominal pain. Recent colonoscopy.

EXAM:
CT ABDOMEN AND PELVIS WITH CONTRAST
TECHNIQUE: Multidetector CT imaging of the abdomen and pelvis was performed
using the standard protocol following bolus administration of
intravenous contrast.
CONTRAST:  100mL OMNIPAQUE IOHEXOL 300 MG/ML  SOLN

[Series 2: axial st · axial · 0.80mm/px · z∈[+1061,+1516]mm · 13 of 105 slices shown, 15 images]
[im 7/105  soft-tissue]
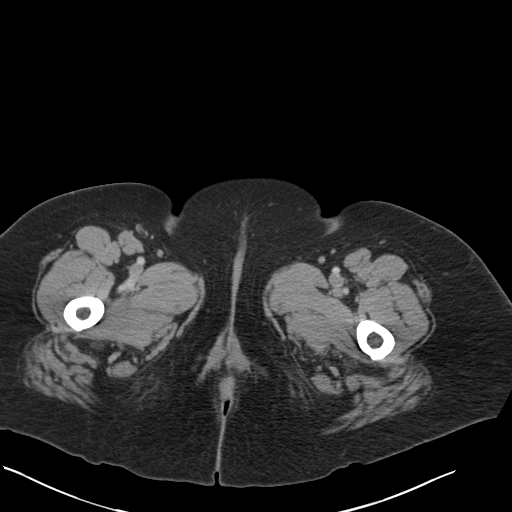
[im 7/105  bone]
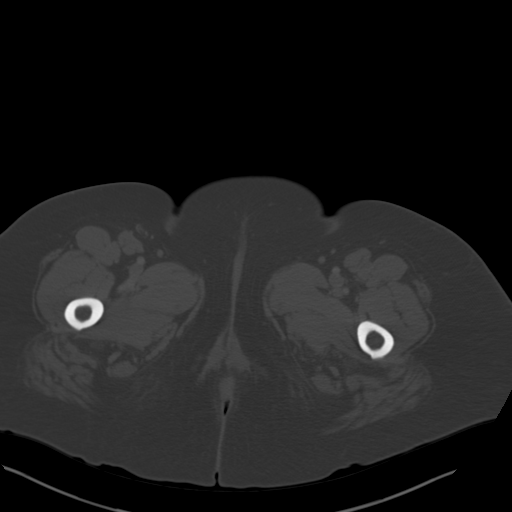
[im 14/105  soft-tissue]
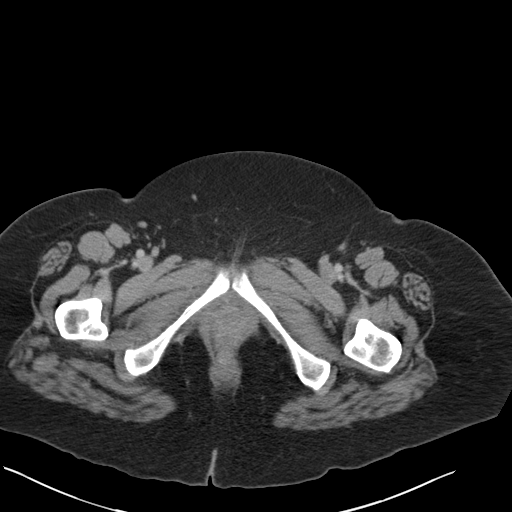
[im 21/105  soft-tissue]
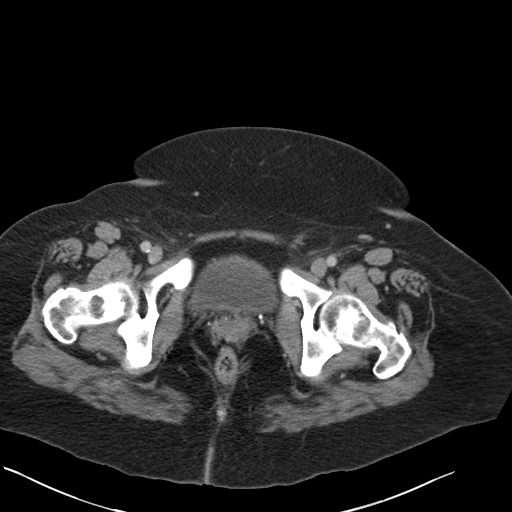
[im 28/105  soft-tissue]
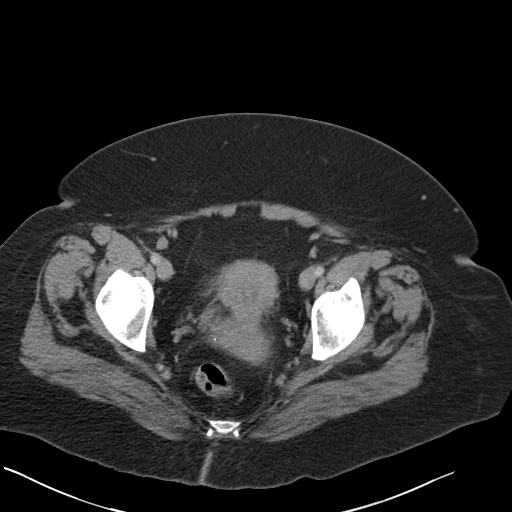
[im 35/105  soft-tissue]
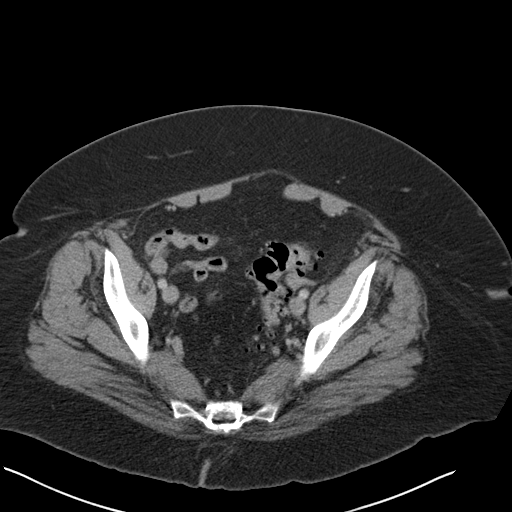
[im 42/105  soft-tissue]
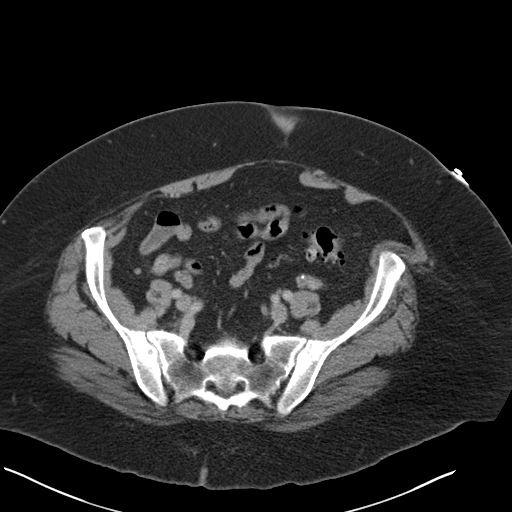
[im 56/105  soft-tissue]
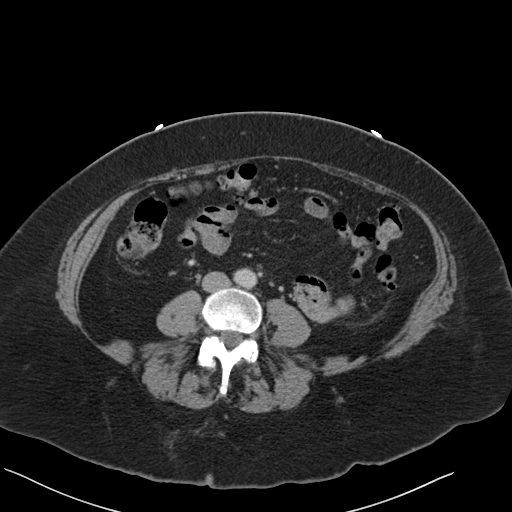
[im 63/105  soft-tissue]
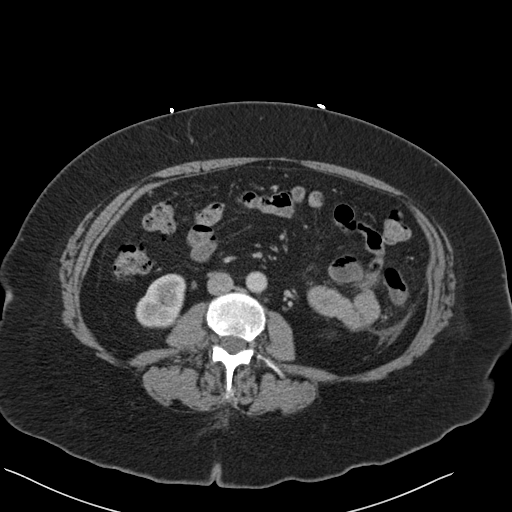
[im 70/105  soft-tissue]
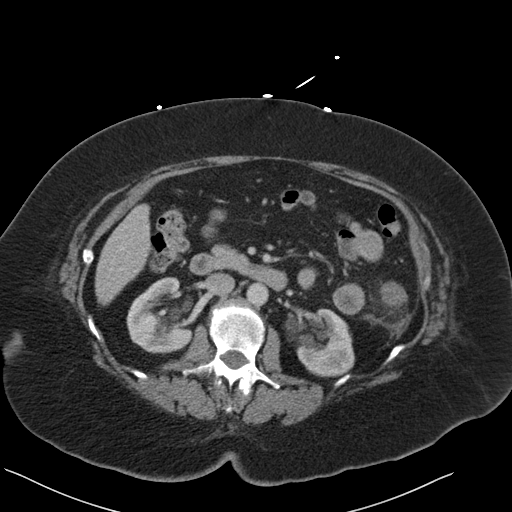
[im 70/105  bone]
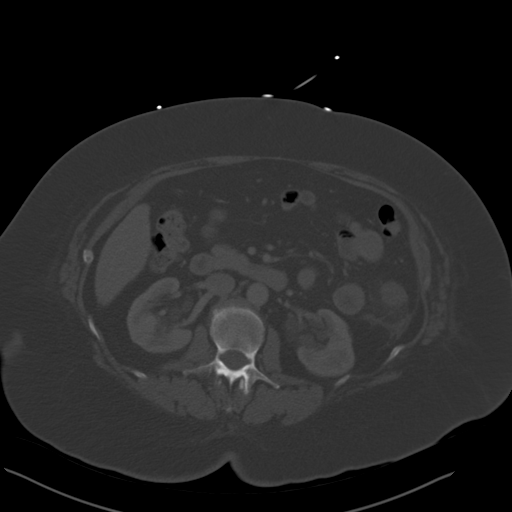
[im 77/105  soft-tissue]
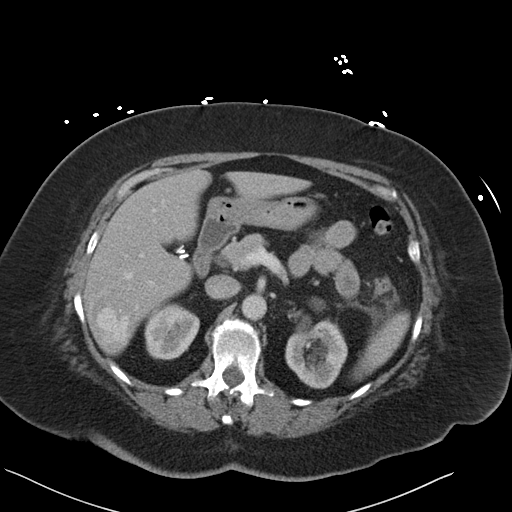
[im 84/105  soft-tissue]
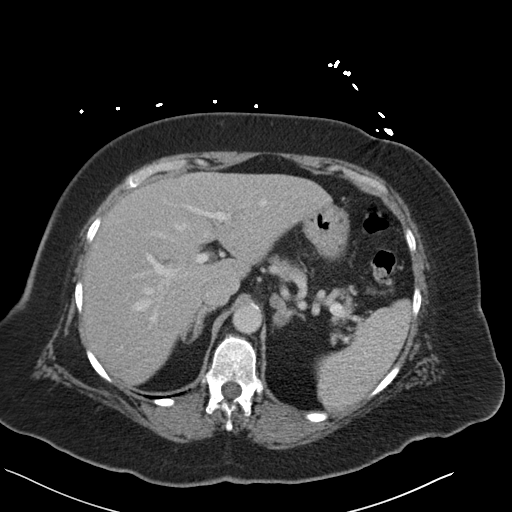
[im 91/105  soft-tissue]
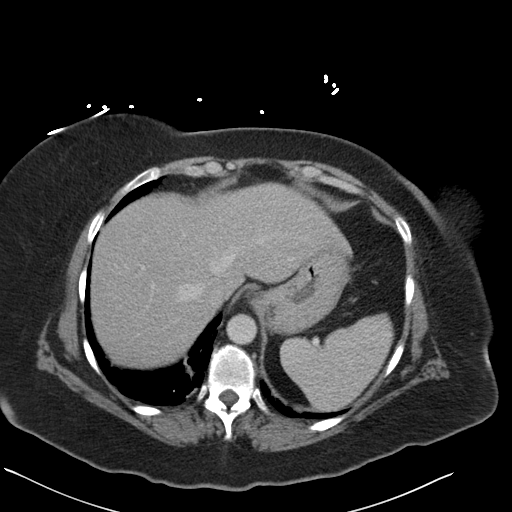
[im 98/105  soft-tissue]
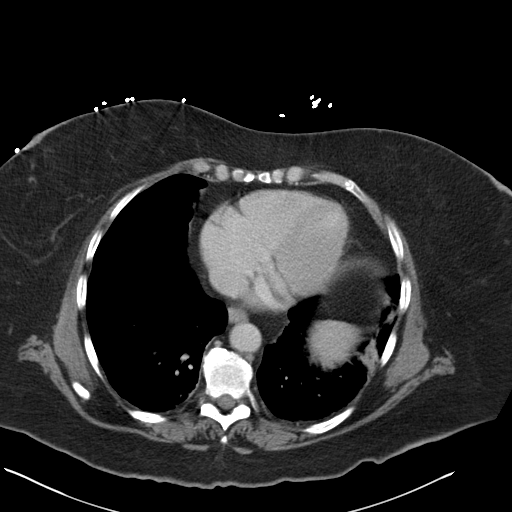

[Series 5: coronal st · coronal · 0.99mm/px · 3 of 147 slices shown]
[im 49/147  soft-tissue]
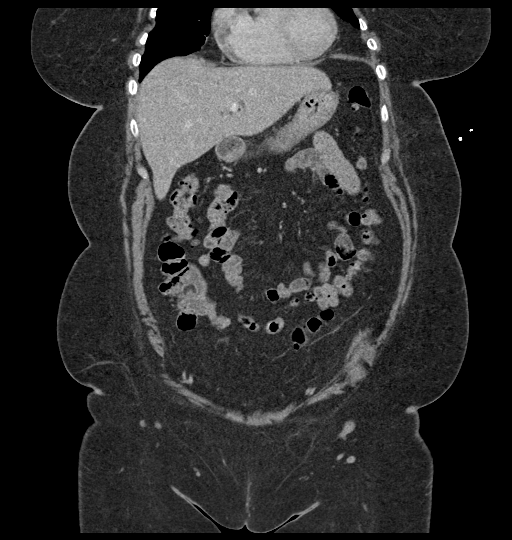
[im 65/147  soft-tissue]
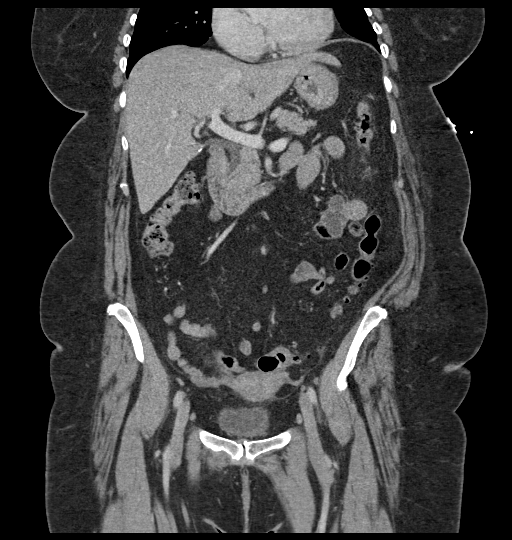
[im 82/147  soft-tissue]
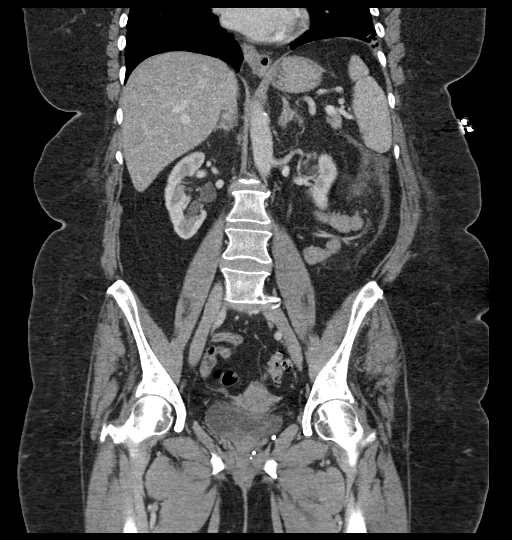

[16 of 46 positions shown; findings below may reference images not displayed]

FINDINGS: Lower chest: Left lower lobe subsegmental atelectasis versus mild
airspace consolidation.

Hepatobiliary: Mild hepatic steatosis. Vascular malformation in the
right posterior hepatic lobe is again seen. Post cholecystectomy.

Pancreas: Unremarkable. No pancreatic ductal dilatation or
surrounding inflammatory changes.

Spleen: 9 mm splenic lesion, indeterminate.

Adrenals/Urinary Tract: Adrenal glands are unremarkable. Kidneys are
without renal calculi, focal lesion, or hydronephrosis. Bilateral
parapelvic cysts. Bladder is unremarkable.

Stomach/Bowel: Stomach is within normal limits. Appendix appears
normal. No evidence of small bowel wall thickening, distention, or
inflammatory changes. Scattered right colonic diverticulosis.
Diffuse left colonic diverticulosis. There is an inflamed
diverticulum in the proximal descending colon with focal mucosal
thickening and pericolonic inflammatory changes. Small amount of
free fluid in the left pericolic gutter. No evidence of frank
abscess formation or rupture.

Vascular/Lymphatic: No significant vascular findings are present. No
enlarged abdominal or pelvic lymph nodes.

Reproductive: Uterus and bilateral adnexa are unremarkable.

Other: No abdominal wall hernia or abnormality.

Musculoskeletal: Spondylosis of the lumbosacral spine.
IMPRESSION: 1. Diffuse left colonic diverticulosis. Acute diverticulitis of the
proximal descending colon. Small amount of free fluid in the left
pericolic gutter. No evidence of frank abscess formation or rupture.
2. Mild hepatic steatosis.
3. Bilateral parapelvic renal cysts.
4. Left lower lobe subsegmental atelectasis versus mild airspace
consolidation.
